# Patient Record
Sex: Female | Born: 1960
Health system: Southern US, Community
[De-identification: ages and names within clinical notes are randomized; demographics above are authoritative.]

## PROBLEM LIST (undated history)

## (undated) DIAGNOSIS — E785 Hyperlipidemia, unspecified: Secondary | ICD-10-CM

## (undated) DIAGNOSIS — G500 Trigeminal neuralgia: Secondary | ICD-10-CM

## (undated) DIAGNOSIS — K219 Gastro-esophageal reflux disease without esophagitis: Secondary | ICD-10-CM

## (undated) DIAGNOSIS — M1990 Unspecified osteoarthritis, unspecified site: Secondary | ICD-10-CM

## (undated) DIAGNOSIS — G4700 Insomnia, unspecified: Secondary | ICD-10-CM

## (undated) DIAGNOSIS — M310 Hypersensitivity angiitis: Secondary | ICD-10-CM

## (undated) DIAGNOSIS — E7800 Pure hypercholesterolemia, unspecified: Secondary | ICD-10-CM

## (undated) DIAGNOSIS — I10 Essential (primary) hypertension: Secondary | ICD-10-CM

## (undated) DIAGNOSIS — M79673 Pain in unspecified foot: Secondary | ICD-10-CM

## (undated) DIAGNOSIS — F419 Anxiety disorder, unspecified: Secondary | ICD-10-CM

## (undated) DIAGNOSIS — M542 Cervicalgia: Secondary | ICD-10-CM

## (undated) HISTORY — PX: CERVICAL SPINE SURGERY: SHX589

## (undated) HISTORY — PX: MOUTH SURGERY: SHX715

## (undated) HISTORY — DX: Hypersensitivity angiitis: M31.0

## (undated) HISTORY — DX: Hyperlipidemia, unspecified: E78.5

## (undated) HISTORY — DX: Cervicalgia: M54.2

## (undated) HISTORY — PX: CARPAL TUNNEL RELEASE: SHX101

## (undated) HISTORY — PX: FINGER SURGERY: SHX640

## (undated) HISTORY — DX: Pain in unspecified foot: M79.673

## (undated) HISTORY — PX: EYE SURGERY: SHX253

## (undated) HISTORY — DX: Pure hypercholesterolemia, unspecified: E78.00

## (undated) HISTORY — DX: Insomnia, unspecified: G47.00

## (undated) HISTORY — PX: ENDOMETRIAL ABLATION: SHX621

## (undated) HISTORY — DX: Trigeminal neuralgia: G50.0

## (undated) HISTORY — PX: BREAST SURGERY: SHX581

---

## 1998-05-05 ENCOUNTER — Other Ambulatory Visit: Admission: RE | Admit: 1998-05-05 | Discharge: 1998-05-05 | Payer: Self-pay | Admitting: Obstetrics & Gynecology

## 1999-12-12 ENCOUNTER — Other Ambulatory Visit: Admission: RE | Admit: 1999-12-12 | Discharge: 1999-12-12 | Payer: Self-pay | Admitting: Obstetrics & Gynecology

## 2000-03-17 ENCOUNTER — Ambulatory Visit (HOSPITAL_COMMUNITY): Admission: RE | Admit: 2000-03-17 | Discharge: 2000-03-17 | Payer: Self-pay | Admitting: Family Medicine

## 2001-03-03 ENCOUNTER — Other Ambulatory Visit: Admission: RE | Admit: 2001-03-03 | Discharge: 2001-03-03 | Payer: Self-pay | Admitting: Obstetrics & Gynecology

## 2002-07-13 ENCOUNTER — Other Ambulatory Visit: Admission: RE | Admit: 2002-07-13 | Discharge: 2002-07-13 | Payer: Self-pay | Admitting: Obstetrics & Gynecology

## 2002-12-24 ENCOUNTER — Ambulatory Visit (HOSPITAL_COMMUNITY): Admission: RE | Admit: 2002-12-24 | Discharge: 2002-12-24 | Payer: Self-pay | Admitting: *Deleted

## 2002-12-24 ENCOUNTER — Encounter: Payer: Self-pay | Admitting: *Deleted

## 2003-01-14 ENCOUNTER — Encounter: Payer: Self-pay | Admitting: Neurosurgery

## 2003-01-18 ENCOUNTER — Inpatient Hospital Stay (HOSPITAL_COMMUNITY): Admission: RE | Admit: 2003-01-18 | Discharge: 2003-01-19 | Payer: Self-pay | Admitting: Neurosurgery

## 2003-01-18 ENCOUNTER — Encounter: Payer: Self-pay | Admitting: Neurosurgery

## 2003-08-04 ENCOUNTER — Other Ambulatory Visit: Admission: RE | Admit: 2003-08-04 | Discharge: 2003-08-04 | Payer: Self-pay | Admitting: Obstetrics & Gynecology

## 2004-08-02 ENCOUNTER — Ambulatory Visit (HOSPITAL_BASED_OUTPATIENT_CLINIC_OR_DEPARTMENT_OTHER): Admission: RE | Admit: 2004-08-02 | Discharge: 2004-08-02 | Payer: Self-pay | Admitting: Orthopedic Surgery

## 2004-08-20 ENCOUNTER — Encounter: Admission: RE | Admit: 2004-08-20 | Discharge: 2004-11-18 | Payer: Self-pay | Admitting: Orthopedic Surgery

## 2004-11-14 ENCOUNTER — Other Ambulatory Visit: Admission: RE | Admit: 2004-11-14 | Discharge: 2004-11-14 | Payer: Self-pay | Admitting: Obstetrics & Gynecology

## 2005-02-04 ENCOUNTER — Encounter (INDEPENDENT_AMBULATORY_CARE_PROVIDER_SITE_OTHER): Payer: Self-pay | Admitting: *Deleted

## 2005-02-04 ENCOUNTER — Ambulatory Visit (HOSPITAL_COMMUNITY): Admission: RE | Admit: 2005-02-04 | Discharge: 2005-02-04 | Payer: Self-pay | Admitting: Obstetrics & Gynecology

## 2005-12-03 ENCOUNTER — Other Ambulatory Visit: Admission: RE | Admit: 2005-12-03 | Discharge: 2005-12-03 | Payer: Self-pay | Admitting: Obstetrics & Gynecology

## 2006-01-31 ENCOUNTER — Ambulatory Visit (HOSPITAL_COMMUNITY): Admission: RE | Admit: 2006-01-31 | Discharge: 2006-01-31 | Payer: Self-pay | Admitting: Neurosurgery

## 2007-01-01 ENCOUNTER — Ambulatory Visit (HOSPITAL_COMMUNITY): Admission: RE | Admit: 2007-01-01 | Discharge: 2007-01-01 | Payer: Self-pay | Admitting: Obstetrics & Gynecology

## 2008-01-14 ENCOUNTER — Ambulatory Visit (HOSPITAL_BASED_OUTPATIENT_CLINIC_OR_DEPARTMENT_OTHER): Admission: RE | Admit: 2008-01-14 | Discharge: 2008-01-14 | Payer: Self-pay | Admitting: Orthopedic Surgery

## 2008-01-28 ENCOUNTER — Ambulatory Visit (HOSPITAL_COMMUNITY): Admission: RE | Admit: 2008-01-28 | Discharge: 2008-01-28 | Payer: Self-pay | Admitting: Obstetrics & Gynecology

## 2009-06-29 ENCOUNTER — Ambulatory Visit (HOSPITAL_COMMUNITY): Admission: RE | Admit: 2009-06-29 | Discharge: 2009-06-29 | Payer: Self-pay | Admitting: Obstetrics & Gynecology

## 2009-10-29 ENCOUNTER — Emergency Department (HOSPITAL_COMMUNITY): Admission: EM | Admit: 2009-10-29 | Discharge: 2009-10-29 | Payer: Self-pay | Admitting: Emergency Medicine

## 2010-08-14 ENCOUNTER — Encounter
Admission: RE | Admit: 2010-08-14 | Discharge: 2010-08-14 | Payer: Self-pay | Source: Home / Self Care | Attending: Family Medicine | Admitting: Family Medicine

## 2010-09-13 DIAGNOSIS — F411 Generalized anxiety disorder: Secondary | ICD-10-CM | POA: Insufficient documentation

## 2010-09-13 DIAGNOSIS — I776 Arteritis, unspecified: Secondary | ICD-10-CM | POA: Insufficient documentation

## 2010-09-13 DIAGNOSIS — K219 Gastro-esophageal reflux disease without esophagitis: Secondary | ICD-10-CM | POA: Insufficient documentation

## 2010-09-13 DIAGNOSIS — I10 Essential (primary) hypertension: Secondary | ICD-10-CM | POA: Insufficient documentation

## 2010-09-13 DIAGNOSIS — E785 Hyperlipidemia, unspecified: Secondary | ICD-10-CM | POA: Insufficient documentation

## 2010-09-14 ENCOUNTER — Ambulatory Visit
Admission: RE | Admit: 2010-09-14 | Discharge: 2010-09-14 | Payer: Self-pay | Source: Home / Self Care | Attending: Pulmonary Disease | Admitting: Pulmonary Disease

## 2010-09-14 DIAGNOSIS — R05 Cough: Secondary | ICD-10-CM | POA: Insufficient documentation

## 2010-09-20 NOTE — Assessment & Plan Note (Signed)
Summary: Pulmonary consultation/ chronic cough   Visit Type:  Initial Consult Copy to:  Dr. Blair Heys Primary Provider/Referring Provider:  Dr. Blair Heys  CC:  Cough.  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 rx with Nexium helped hb ? if helped cough, then stopped p 6 weeks much worse cough and reflux, so Nexium resumed at hs and some better.  Pt denies any significant  itching, sneezing,  nasal congestion or excess secretions,  fever, chills, sweats, unintended wt loss, pleuritic or exertional cp, hempoptysis, change in activity tolerance  orthopnea pnd or leg swelling. Pt also denies any obvious fluctuation in symptoms with weather or environmental change or other alleviating or aggravating factors.       Allergies (verified): 1)  ! 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  The patient denies shortness of breath at rest, productive cough, coughing up blood, chest pain, irregular heartbeats, loss of  appetite, weight change, abdominal pain, sore throat, tooth/dental problems, headaches, sneezing, itching, ear ache, anxiety, depression, hand/feet swelling, joint stiffness or pain, rash, change in color of mucus, and fever.    Vital Signs:  Patient profile:   50 year old female Height:      69 inches Weight:      201 pounds BMI:     29.79 O2 Sat:      98 % on Room air Temp:     98.5 degrees F oral Pulse rate:   78 / minute BP sitting:   122 / 70  (left arm)  Vitals Entered By: Vernie Murders (September 14, 2010 9:49 AM)  O2 Flow:  Room air  Physical Exam  Additional Exam:  wt 201 September 14, 2010   HEENT: nl dentition, mild nonspecific bilateral edema of both turbinates, and orophanx. Nl external ear canals without cough reflex NECK :  without JVD/Nodes/TM/ nl carotid upstrokes bilaterally LUNGS: no acc muscle use, clear to A and P bilaterally with  cough on early  insp   CV:  RRR  no s3 or murmur or increase in P2, no edema  ABD:  soft and nontender with nl excursion in the supine position. No bruits or organomegaly, bowel sounds nl MS:  warm without deformities, calf tenderness, cyanosis or clubbing SKIN: warm and dry without lesions   NEURO:  alert, approp, no deficits     CXR  Procedure date:  08/14/2010  Findings:      wnl  Impression & Recommendations:  Problem # 1:  COUGH (ICD-786.2)  The most common causes of chronic cough in immunocompetent adults include:  upper airway cough syndrome (UACS), previously referred to as postnasal drip syndrome,  caused by variety of rhinosinus conditions; (2) asthma; (3) GERD; (4) chronic bronchitis from cigarette smoking or other inhaled environmental irritants; (5) nonasthmatic eosinophilic bronchitis; and (6) bronchiectasis. These conditions, singly or in combination, have accounted for up to 94% of the causes of chronic cough in prospective studies.   Most likely this is a form of  Classic Upper airway cough syndrome, so named  because it's frequently impossible to sort out how much is  CR/sinusitis with freq throat clearing (which can be related to primary GERD)   vs  causing  secondary extra esophageal GERD from wide swings in gastric pressure that occur with throat clearing, promoting self use of mint and menthol lozenges that reduce the lower esophageal sphincter tone and exacerbate the problem further These are the same pts who not infrequently have failed to tolerate ace inhibitors,  dry powder inhalers or biphosphonates or report having reflux symptoms that don't respond to standard doses of PPI  Note she could not tolerate ace and is not responding to standard doses of ppi.  The standardized cough guidelines recently published in Chest are a 14 step process, not a single office visit,  and are intended  to address this problem logically,  with an alogrithm dependent on response to each progressive step  to determine a specific diagnosis with  minimal addtional testing needed. Therefore if compliance is an issue this empiric standardized approach simply won't work.   See instructions for specific recommendations   Orders: Consultation Level V [phone])  Problem # 2:  G E R D (ICD-530.81) Of the three most common causes of chronic cough, only one (GERD)  can actually cause the other two and perpetuate the cylce of cough inducing airway trauma, inflammation, heightened sensitivity to reflux which is prompted by the cough itself via a cyclical mechanism.  This may partially respond to steroids and look like asthma and post nasal drainage but never erradicated completely unless the cough and the secondary reflux are eliminated, preferably both at the same time. See instructions for specific recommendations   Medications Added to Medication List This Visit: 1)  Tramadol Hcl 50 Mg Tabs (Tramadol hcl) .... One to two by mouth every 4-6 hours if needed for cough 2)  Pepcid 20 Mg Tabs (Famotidine) .... Take one by mouth at  bedtime 3)  Prednisone 10 Mg Tabs (Prednisone) .... 4 each am x 2days, 2x2days, 1x2days and stop  Patient Instructions: 1)  Prednisone 10 mg 4 each am x 2days, 2x2days, 1x2days and stop  2)  Take delsym two tsp every 12 hours and add tramadol 50 mg up to 2 every 4 hours to suppress the urge to cough. Swallowing water or using ice chips/non mint and menthol containing candies (such as lifesavers or sugarless jolly ranchers) are also effective.  3)  Nexium 40 mg 30 min before first meal and pepcid 20 mg at bedtime as long as coughing ( reflux is to cough what oxygen is to fire)  4)  GERD (REFLUX)  is a common cause of respiratory symptoms. It commonly presents without heartburn and can be treated with medication, but also with lifestyle changes including avoidance of late meals, excessive alcohol, smoking cessation, and avoid fatty foods, chocolate, peppermint, colas, red wine, and acidic juices such as orange juice. NO MINT OR MENTHOL PRODUCTS SO NO COUGH DROPS  5)  USE SUGARLESS CANDY INSTEAD (jolley ranchers)  6)  NO  OIL BASED VITAMINS  7)  If not better in two weeks need to return to regroup 8)  LATE ADD did not disclose celexa but should stop this while requiring high doses of tramadol to control cyclical coughing Prescriptions: PREDNISONE 10 MG  TABS (PREDNISONE) 4 each am x 2days, 2x2days, 1x2days and stop  #14 x 0   Entered and Authorized by:   Nyoka Cowden MD   Signed by:   Nyoka Cowden MD on 09/14/2010   Method used:   Electronically to        [facility]* (retail)       [address]       [place], [place]  [address]       Ph: [phone]       Fax: [phone]   RxID:   [id number] TRAMADOL HCL 50 MG  TABS (TRAMADOL HCL) One to two by mouth every 4-6 hours if needed for cough  #40 x 0   Entered and Authorized by:   Nyoka Cowden MD   Signed by:   Nyoka Cowden MD on 09/14/2010   Method used:   Electronically to        [facility]*  (retail)       [address]       [place], [place]  [address]       Ph: [phone]       Fax: [phone]   RxID:   [id number]

## 2010-12-17 ENCOUNTER — Other Ambulatory Visit (HOSPITAL_COMMUNITY): Payer: Self-pay | Admitting: Obstetrics & Gynecology

## 2010-12-17 DIAGNOSIS — Z1231 Encounter for screening mammogram for malignant neoplasm of breast: Secondary | ICD-10-CM

## 2011-01-01 NOTE — Op Note (Signed)
NAMEMONIKA, CHESTANG              ACCOUNT NO.:  [id number]   MEDICAL RECORD NO.:  [id number]          PATIENT TYPE:  AMB   LOCATION:  DSC                          FACILITY:  [facility]   PHYSICIAN:  Dionne Ano. Gramig III, M.D.DATE OF BIRTH:  April 28, 1961   DATE OF PROCEDURE:  DATE OF DISCHARGE:                               OPERATIVE REPORT   PREOPERATIVE DIAGNOSIS:  Left carpal tunnel syndrome.   POSTOPERATIVE DIAGNOSIS:  Left carpal tunnel syndrome.   PROCEDURES:  1. Left median nerve/peripheral nerve block at the wrist and forearm      level for anesthetic purposes for carpal tunnel release.  2. Left limited open carpal tunnel release.   SURGEON:  Dionne Ano. Amanda Pea, MD   ASSISTANT:  None.   COMPLICATIONS:  None.   ANESTHESIA:  Peripheral nerve block with IV sedation keeping the patient  awake, alert, and oriented the entire case.   TOURNIQUET TIME:  Less than 10 minutes.   ESTIMATED BLOOD LOSS:  Minimal.   INDICATIONS FOR PROCEDURE:  Ms. Berdan is a 50 year old female who  presents with the above-mentioned diagnosis.  I have counseled her in  regards to risks and benefits of the surgery including risk of  infection, bleeding anesthesia, damage to normal structures, and failure  of surgery to accomplish its intended goals of relieving symptoms and  restoring function.  With this in mind, she desires to proceed.  All  questions have been encouraged and answered preoperatively.   OPERATIVE PROCEDURE:  The patient was seen by myself, anesthesia, taken  to the operative suite, and underwent a smooth induction of peripheral  nerve/mean nerve block.  She was given 2 of Versed and 1 of fentanyl for  IV sedation during this.  Following this, she was prepped and draped in  the usual sterile fashion with Betadine scrub and paint.  Ancef was  given.  Arm was previously marked and consent obtained.  Following this,  a tourniquet was insufflated to 250 mmHg.  The patient underwent a  1-cm  incision at the distal edge of the transverse carpal ligament.  Dissection was carried down.  Palmar fascia incised distally and the  transverse carpal ligament was released under 4.0 loop magnification  without difficulty.  Fat pad egressed nicely.  Hemostasis was secured.  Following this, distal to proximal dissection was carried out after  adequate room was available for __________ device one, two, and three  which was placed just under the proximal leading leaflet of the  transverse carpal ligament.  The patient tolerated this well.  There  were no complicating features.  Security clip was then placed after it  disengaged, very nicely placed and security clip effectively releasing  the proximal leaflet.  Following this, I inspected the canal.  She was  completely released and looked excellent.  The tourniquet was deflated.  Hemostasis was obtained with bipolar electrocautery.  The wound was  lavaged and sutured with Prolene.  She was dressed sterilely and taken  to the recovery room.  She will be discharged home on Vicodin and  Robaxin.  I have asked her to  elevate, move fingers frequently, and  return to see me in a week and notify me if she have any problems.   It has been an absolute pleasure to see and participate in her care.  We  look forward to participate in her postoperative progress.      Dionne Ano. Everlene Other, M.D.     Nash Mantis  D:  01/14/2008  T:  01/15/2008  Job:  [id number]

## 2011-01-03 ENCOUNTER — Other Ambulatory Visit (HOSPITAL_COMMUNITY): Payer: Self-pay | Admitting: Obstetrics & Gynecology

## 2011-01-03 ENCOUNTER — Ambulatory Visit (HOSPITAL_COMMUNITY)
Admission: RE | Admit: 2011-01-03 | Discharge: 2011-01-03 | Disposition: A | Discharge status: Home / Self Care | Payer: BC Managed Care – PPO | Source: Ambulatory Visit | Attending: Obstetrics & Gynecology | Admitting: Obstetrics & Gynecology

## 2011-01-03 DIAGNOSIS — Z1231 Encounter for screening mammogram for malignant neoplasm of breast: Secondary | ICD-10-CM

## 2011-01-04 NOTE — Op Note (Signed)
NAME:  Charlotte [name], Charlotte [name]                        ACCOUNT NO.:  [id number]   MEDICAL RECORD NO.:  [id number]                   PATIENT TYPE:  INP   LOCATION:  2861                                 FACILITY:  [facility]   PHYSICIAN:  Payton Doughty, M.D.                   DATE OF BIRTH:  11/25/1960   DATE OF PROCEDURE:  01/18/2003  DATE OF DISCHARGE:                                 OPERATIVE REPORT   PREOPERATIVE DIAGNOSES:  Cervical spondylosis at C4-5 and C5-6 with  myelopathy.   POSTOPERATIVE DIAGNOSES:  Cervical spondylosis at C4-5 and C5-6 with  myelopathy.   OPERATION/PROCEDURE:  C4-5 and C5-6 anterior cervical diskectomy and fusion  with a Tiger plate.   OPERATING SERVICE:  Neurosurgery   ANESTHESIA:  General endotracheal.   COMPLICATIONS:  None.   NURSE ASSISTANT:  [place].   DOCTOR ASSISTANT:  Dr. Lovell Sheehan.   DESCRIPTION OF PROCEDURE:  This is a 50 year old right-handed white female  with severe cervical spondylitis myelopathy.  She was taken to the operating  room, smoothly induced and  intubated, placed supine on the operating room  table, placed in the Holter head traction.  Following shave, prep and drape  in the usual sterile fashion, the skin was incised in the midline in the  medial border of the sternocleidomastoid muscle on the left side at about  the level of the carotid tubercle.  The platysma was identified, elevated,  divided and undermined.  The sternocleidomastoid was then identified, medial  dissection revealed the carotid artery retracted laterally to the left.  The  trachea and esophagus retracted laterally to the right.  The bones of the  anterior cervical spine were thus exposed.  A marker was placed intra-  operatively x-ray obtain to confirm correctness of this level.  The longus  coli were taken down bilaterally and self-retaining Chevron retractor  placed, carefully protecting the esophagus.  The diskectomy was carried out  at C5-6 and C4-5, first  under gross observation.  The operating microscope  was then brought in and we used microdissection technique to dissect the  anterior epidural space, remove the posterior longitudinal ligament and  carefully decompress the nerve roots.  Off to the right side at each level,  there was significant disk with compression of the thecal sac.  Removal of  this resulted in immediate decompression.  The nerve roots were carefully  explored and found to be unencumbered.  The wound was irrigated and  hemostasis was assured.  A 7 mm bone graft was fashioned,  a patellar  allograft, and tapped into place at each level.  A 41 mm Tiger plate was  then contoured and placed across the disk spaces.  It was secured there with  13 mm screws.  Intraoperative x-rays showed good placement of bone graft,  the plate and screws.  The wound was irrigated and hemostasis assured.  The  platysma  was reapproximated with 3-0 Vicryl in an interrupted fashion.  The  subcutaneous tissue was reapproximated with 3-0 Vicryl in an interrupted  fashion.  The subcuticular tissues were reapproximated with 3-0  Vicryl in an interrupted fashion.  The skin was closed with 4-0 Vicryl in a  running subcuticular fashion.  Benzoin and Steri-Strips were placed and  occlusive Telfa and OpSite and the patient returned to the recovery room in  good condition after being placed in the Aspen collar.                                                Payton Doughty, M.D.    MWR/MEDQ  D:  01/18/2003  T:  01/18/2003  Job:  [id number]

## 2011-01-04 NOTE — Op Note (Signed)
NAMEKENDALLYN, Charlotte [name]              ACCOUNT NO.:  [id number]   MEDICAL RECORD NO.:  [id number]          PATIENT TYPE:  AMB   LOCATION:  SDC                           FACILITY:  [facility]   PHYSICIAN:  Freddy Finner, M.D.   DATE OF BIRTH:  [birth year]   DATE OF PROCEDURE:  02/04/2005  DATE OF DISCHARGE:                                 OPERATIVE REPORT   PREOPERATIVE DIAGNOSES:  1.  Fibroids.  2.  Menorrhagia.   POSTOPERATIVE DIAGNOSES:  1.  Fibroids.  2.  Menorrhagia.   OPERATIVE PROCEDURE:  Hysteroscopy D&C and NovaSure endometrial ablation.   SURGEON:  Freddy Finner, M.D.   ANESTHESIA:  Managed intravenous sedation and paracervical block.   INTRAOPERATIVE COMPLICATIONS:  None.   The patient is a 50 year old with very heavy menstrual periods. She had a  sonohysterogram in the office which showed no intracavitary abnormality but  did show intramural leiomyomata. Because of the very heavy nature of her  periods and the relative disability that this produces, she has requested  surgical  intervention. She is now admitted for NovaSure endometrial  ablation.   She was admitted on the morning of surgery. She was given an antibiotic  bolus preoperatively. She was brought to the operating room, there placed  under adequate intravenous sedation, placed in the dorsal lithotomy position  using the Hana stirrup system. Betadine prep was carried out in the usual  fashion. Sterile drapes were applied. Bivalve speculum was introduced into  the vagina, cervix visualized and grasped on the anterior lip with a single  tooth tenaculum. Cervix sounded to 3 cm. Uterus sounded to 9 cm. Cervix was  dilated with Hegar dilators to 23. The 12.5-degree A[facility]I hysteroscope was  introduced and the cavity inspected. No intracavitary abnormality was noted.  Photographs were made. Gentle thorough curettage and exploration with  Randall stone forceps were performed and tissue submitted for histologic  exam. The  NovaSure device was then applied in the standard fashion without  difficulty. Cavity width was 4.8 cm. Power utilized during the procedure was  158 watts. Coagulation time 59 seconds. Reinspection of the cavity revealed  complete endometrial ablation by inspection. Again, photographs were  made of this and retained in the office record. The instruments were  removed, the procedure was terminated. She was given 30 mg of Toradol IV, 30  mg IM, and was taken to recovery in good condition. She will be discharged  in the immediate postoperative period for follow-up in the office at  approximately 7-10 days.      Freddy Finner, M.D.  Electronically Signed     WRN/MEDQ  D:  02/04/2005  T:  02/04/2005  Job:  [id number]

## 2011-01-04 NOTE — H&P (Signed)
NAME:  Charlotte [name], Charlotte [name]                        ACCOUNT NO.:  [id number]   MEDICAL RECORD NO.:  [id number]                   PATIENT TYPE:  INP   LOCATION:  2861                                 FACILITY:  [facility]   PHYSICIAN:  Payton Doughty, M.D.                   DATE OF BIRTH:  1961/03/03   DATE OF ADMISSION:  01/18/2003  DATE OF DISCHARGE:                                HISTORY & PHYSICAL   ADMISSION DIAGNOSIS:  Cervical spondylosis with myelopathy.   SURGEON:  No surgery.   HISTORY OF PRESENT ILLNESS:  Ms. Rufo is a 50 year old right-handed  white girl who, for approximately years, has been having numbness in her  right arm and noticed it is worsening over the past couple of months, and  stumbling of her right leg.  MR demonstrates severe spondylosis of the  cervical cord with cord compression.  She is now admitted for an anterior  cervicectomy and fusion at C4-5 and C5-6.   PAST MEDICAL HISTORY:  Unremarkable for significant diseases.   MEDICATIONS:  1. She uses Zoloft 100 mg per day.  2. Flexeril 5 mg per day.  3. Vioxx 50 mg per day.   PAST SURGICAL HISTORY:  1. Breast implants in '81.  2. Oral surgery in '80.  3. Laser eye operation in 2002.   ALLERGIES:  She is allergic to VITAMIN C.   SOCIAL HISTORY:  She does not smoke, drinks on a social basis, and is a  Engineer, civil (consulting).   FAMILY HISTORY:  Mother is 22 and very healthy.  Father is 16, in good  health.  There is thyroid disease, arthritis, hypertension, and borderline  diabetes.   REVIEW OF SYSTEMS:  Remarkable for difficulty with coordination.   PHYSICAL EXAMINATION:  HEENT EXAM:  Within normal limits.  NECK:  She has limited range of motion of her neck but some Lhermitte's  sign.  CHEST:  Clear.  CARDIAC EXAM:  Regular rate and rhythm.  ABDOMEN:  Nontender with no hepatosplenomegaly.  EXTREMITIES:  Without clubbing or cyanosis.  GU EXAM:  Deferred.  Peripheral pulses are good.  NEUROLOGICAL:  She is awake,  alert and oriented.  Her cranial nerves are  intact.  Motor exam, she has 5/5 strength throughout the left side and the  right lower extremity; in the right upper extremity, she has weakness of the  deltoid, biceps, and triceps at about 5-/5.  Sensory deficit, as described,  in her right C5-6 distribution.  Reflexes are 3 at the biceps on the right,  1 on the left, 3 at the triceps on the right, 2 on the left.  Right knee  jerk is 3, the left is 2, ankle jerks are 2.  There is no clonus.  Hoffmann's is positive on the right.   LABORATORY DATA:  MR demonstrates significant cervical spondylitic disease  at 4-5 and 5-6 with cord compression.  CLINICAL IMPRESSION:  Cervical spondylosis with myelopathy.   PLAN:  For an anterior cervicectomy and fusion at C4-5 and C5-6.  The risks  and benefits of this approach have been discussed with her, and she wishes  to proceed.                                               Payton Doughty, M.D.    MWR/MEDQ  D:  01/18/2003  T:  01/18/2003  Job:  [id number]

## 2011-01-04 NOTE — Op Note (Signed)
NAMEMARKEL, MERGENTHALER              ACCOUNT NO.:  [id number]   MEDICAL RECORD NO.:  [id number]          PATIENT TYPE:  AMB   LOCATION:  DSC                          FACILITY:  [facility]   PHYSICIAN:  Dionne Ano. Gramig III, M.D.DATE OF BIRTH:  03/28/1961   DATE OF PROCEDURE:  08/02/2004  DATE OF DISCHARGE:                                 OPERATIVE REPORT   PREOPERATIVE DIAGNOSIS:  Right carpal tunnel syndrome.   POSTOPERATIVE DIAGNOSIS:  Right carpal tunnel syndrome.   PROCEDURES:  1.  Right median nerve/peripheral nerve block at the wrist __________ carpal      tunnel.  2.  Right __________ carpal tunnel.   SURGEON:  Dionne Ano. Amanda Pea, M.D.   ASSISTANT:  Karie Chimera, P.A.-C.   COMPLICATIONS:  None.   ANESTHESIA:  Peripheral nerve block administered by M.D. with light IV  sedation keeping the patient awake, alert and oriented during the entire  case.   TOURNIQUET TIME:  Less than 10 minutes.   DRAINS:  None.   ESTIMATED BLOOD LOSS:  Minimal.   INDICATION FOR PROCEDURE:  This patient is a very pleasant female who  presents with the above-mentioned diagnosis.  I have counseled she in regard  to the risks and benefits of surgery, and she agrees to proceed with the  operative intervention as described above.  All risks and benefits were  discussed.   OPERATIVE FINDINGS:  The patient had a thickened transverse carpal ligament  separated by Potts incision.  There were no space-occupying lesions in the  canal; however, deep canal inspection was not carried out as this was  limited to open approach through a 1 cm incision.   OPERATIVE PROCEDURE IN DETAIL:  The patient was seen by myself and  anesthesia, taken to the operative suite, and underwent smooth induction of  peripheral nerve block/median nerve block.  She tolerated this well.  She  was then prepped and draped a second time with Betadine scrub and paint.  She had 1 of fentanyl and 1 of Versed during this and was  completely awake,  alert and oriented in the entire case.  Following this, the patient then  underwent elevation of the arm.  The tourniquet was insufflated to 250 mmHg.  A 1 cm incision was made at the digital edge of the transverse carpal  ligament.  Dissection was carried through skin with a knife blade, the  palmar fascia incised.  The digital edge of the transverse carpal ligament  was released under 4.0 loupe magnification.  The fat pad egressed nicely.  There was no aberrant median nerve anatomy.  Then following this, distal to  proximal dissection was carried out until adequate room was adequate for  canal preparatory device one, two, and three, which were placed directly in  the midline without patient discomfort or problems.  I then placed a  security clip, the obturator disengaged nicely, indicating correct  placement, and following this placed the security knife and the security  clip, effectively releasing the proximal leaf of the transverse carpal  ligament.  Once this was done, I then removed the security  clip.  I  identified the canal.  She was completely released about the transverse  carpal ligament.  The median intact.  There were no complicating features,  and she was awake, alert and oriented the entire case and had no problems.  We deflated the tourniquet, obtained hemostasis, irrigated copiously, and  closed the wound with interrupted Prolene.  She tolerated this well.  She  will be discharged home on Vicodin and Robaxin after a sterile dressing was  applied and she was transferred to the recovery room.  All sponge, needle,  and instrument counts were reported as correct as noted.   It was a pleasure to see and treat her today.  We look forward to  participating in her postoperative care.       WMG/MEDQ  D:  08/02/2004  T:  08/02/2004  Job:  [id number]

## 2011-04-03 ENCOUNTER — Other Ambulatory Visit: Payer: Self-pay | Admitting: Gastroenterology

## 2011-05-15 LAB — BASIC METABOLIC PANEL
CO2: 24
Chloride: 107
GFR calc Af Amer: >60
GFR calc non Af Amer: >60
Potassium: 4.3

## 2011-05-15 LAB — POCT HEMOGLOBIN-HEMACUE: Hemoglobin: 10 — ABNORMAL LOW

## 2012-10-21 ENCOUNTER — Other Ambulatory Visit: Payer: Self-pay | Admitting: Occupational Medicine

## 2012-10-21 ENCOUNTER — Ambulatory Visit: Payer: Self-pay

## 2012-10-21 DIAGNOSIS — M25572 Pain in left ankle and joints of left foot: Secondary | ICD-10-CM

## 2012-11-20 ENCOUNTER — Other Ambulatory Visit: Payer: Self-pay | Admitting: Occupational Medicine

## 2012-11-20 ENCOUNTER — Ambulatory Visit: Payer: Self-pay

## 2012-11-20 DIAGNOSIS — R7612 Nonspecific reaction to cell mediated immunity measurement of gamma interferon antigen response without active tuberculosis: Secondary | ICD-10-CM

## 2013-05-05 ENCOUNTER — Other Ambulatory Visit (HOSPITAL_COMMUNITY): Payer: Self-pay | Admitting: Neurosurgery

## 2013-05-05 DIAGNOSIS — M4712 Other spondylosis with myelopathy, cervical region: Secondary | ICD-10-CM

## 2013-05-12 ENCOUNTER — Ambulatory Visit (HOSPITAL_COMMUNITY)
Admission: RE | Admit: 2013-05-12 | Discharge: 2013-05-12 | Disposition: A | Discharge status: Home / Self Care | Payer: 59 | Source: Ambulatory Visit | Attending: Neurosurgery | Admitting: Neurosurgery

## 2013-05-12 DIAGNOSIS — M5380 Other specified dorsopathies, site unspecified: Secondary | ICD-10-CM | POA: Insufficient documentation

## 2013-05-12 DIAGNOSIS — M5030 Other cervical disc degeneration, unspecified cervical region: Secondary | ICD-10-CM | POA: Insufficient documentation

## 2013-05-12 DIAGNOSIS — M542 Cervicalgia: Secondary | ICD-10-CM | POA: Insufficient documentation

## 2013-05-12 DIAGNOSIS — M79609 Pain in unspecified limb: Secondary | ICD-10-CM | POA: Insufficient documentation

## 2013-05-12 DIAGNOSIS — T8489XA Other specified complication of internal orthopedic prosthetic devices, implants and grafts, initial encounter: Secondary | ICD-10-CM | POA: Insufficient documentation

## 2013-05-12 DIAGNOSIS — Y831 Surgical operation with implant of artificial internal device as the cause of abnormal reaction of the patient, or of later complication, without mention of misadventure at the time of the procedure: Secondary | ICD-10-CM | POA: Insufficient documentation

## 2013-05-12 DIAGNOSIS — M4712 Other spondylosis with myelopathy, cervical region: Secondary | ICD-10-CM

## 2013-05-12 DIAGNOSIS — Z981 Arthrodesis status: Secondary | ICD-10-CM | POA: Insufficient documentation

## 2013-05-27 ENCOUNTER — Other Ambulatory Visit: Payer: Self-pay | Admitting: Neurosurgery

## 2013-07-01 ENCOUNTER — Encounter (HOSPITAL_COMMUNITY): Payer: Self-pay | Admitting: Pharmacy Technician

## 2013-07-02 ENCOUNTER — Encounter (HOSPITAL_COMMUNITY): Payer: Self-pay

## 2013-07-02 ENCOUNTER — Encounter (HOSPITAL_COMMUNITY)
Admission: RE | Admit: 2013-07-02 | Discharge: 2013-07-02 | Disposition: A | Discharge status: Home / Self Care | Payer: 59 | Source: Ambulatory Visit | Attending: Neurosurgery | Admitting: Neurosurgery

## 2013-07-02 DIAGNOSIS — Z01812 Encounter for preprocedural laboratory examination: Secondary | ICD-10-CM | POA: Insufficient documentation

## 2013-07-02 HISTORY — DX: Anxiety disorder, unspecified: F41.9

## 2013-07-02 HISTORY — DX: Unspecified osteoarthritis, unspecified site: M19.90

## 2013-07-02 LAB — BASIC METABOLIC PANEL
Calcium: 8.9 mg/dL (ref 8.4–10.5)
Chloride: 103 mEq/L (ref 96–112)
GFR calc non Af Amer: >90 mL/min (ref >90)
Glucose, Bld: 95 mg/dL (ref 70–99)

## 2013-07-02 LAB — CBC
MCV: 87.9 fL (ref 78.0–100.0)
RBC: 4.28 MIL/uL (ref 3.87–5.11)

## 2013-07-02 LAB — SURGICAL PCR SCREEN: Staphylococcus aureus: NEGATIVE

## 2013-07-02 NOTE — Progress Notes (Addendum)
Patient would like to speak with surgeon prior to signing permit.   DA Requesting stress test from Dr. Erskine Emery Smith's...Marland KitchenDA

## 2013-07-02 NOTE — Pre-Procedure Instructions (Signed)
KHALEELAH YOWELL  07/02/2013   Your procedure is scheduled on: Tuesday, November 18th     Report to [facility]  2 * 3 at 5:30 AM.   Call this number if you have problems the morning of surgery: [phone]   Remember:   Do not eat food or drink liquids after midnight Monday.   Take these medicines the morning of surgery with A SIP OF WATER: Xanax, Celexa   Do not wear jewelry, make-up or nail polish.  Do not wear lotions, powders, or perfumes. You may wear deodorant.  Do not shave unerarms & legs 48 hours prior to surgery.    Do not bring valuables to the hospital.  [facility] is not responsible for any belongings or valuables.               Contacts, dentures or bridgework may not be worn into surgery.  Leave suitcase in the car. After surgery it may be brought to your room.  For patients admitted to the hospital, discharge time is determined by your treatment team.               Name and phone number of your driver:  Rosanne Ashing  --  Spouse  Special Instructions: Shower using CHG 2 nights before surgery and the night before surgery.  If you shower the day of surgery use CHG.  Use special wash - you have one bottle of CHG for all showers.  You should use approximately 1/3 of the bottle for each shower.   Please read over the following fact sheets that you were given: Pain Booklet, MRSA Information and Surgical Site Infection Prevention

## 2013-07-05 ENCOUNTER — Other Ambulatory Visit (HOSPITAL_COMMUNITY): Payer: Self-pay | Admitting: Obstetrics & Gynecology

## 2013-07-05 DIAGNOSIS — Z1231 Encounter for screening mammogram for malignant neoplasm of breast: Secondary | ICD-10-CM

## 2013-07-05 MED ORDER — CEFAZOLIN SODIUM-DEXTROSE 2-3 GM-% IV SOLR
2.0000 g | INTRAVENOUS | Status: AC
  Administered 2013-07-06: 2 g via INTRAVENOUS
  Filled 2013-07-05: qty 50

## 2013-07-05 NOTE — H&P (Signed)
> [address] [place], [place] [address] Phone: [phone]   Patient ID:   [id number] Patient: Charlotte [name]  Date of Birth: 1960/11/02 Visit Type: Office Visit   Date: 05/26/2013 08:30 AM Provider: Danae Orleans. Venetia Maxon   Historian: self  This 52 year old female presents for Follow Up of neck pain.  HISTORY OF PRESENT ILLNESS: 1.  Follow Up of neck pain   Pt returns with her husband to discuss MRI findings.  Cervical MRI & x-rays on [facility].  Patient is still complaining of severe right arm pain and upper neck pain.  This appears to be in the C4 distribution principally but also she has a C6 radiculopathy on the right with numbness into her right thumb.  She does not appear to focal weakness on confrontational testing.  She has a markedly positive Spurling maneuver to the right.  Her deltoid appears to be relatively spared in terms of symptomatology.   Medical/Surgical/Interim History  Reviewed, no change.  Last detailed document date:05/05/2013.    PAST MEDICAL HISTORY, SURGICAL HISTORY, FAMILY HISTORY, SOCIAL HISTORY AND REVIEW OF SYSTEMS I have reviewed the patient's past medical, surgical, family and social history as well as the comprehensive review of systems as included on the [facility] history form dated 05/05/2013, which I have signed.  Family History: Reviewed, no changes.  Last detailed document date:05/05/2013.   Social History: Reviewed, no changes. Last detailed document date: 05/05/2013.      Medications (added, continued or stopped this visit):   Medication Dose Prescribed Else Ind Started Stopped  Ambien CR 12.5 mg tablet,extended release 12.5 mg Y    citalopram 20 mg tablet 20 mg Y    Minivelle 0.1 mg/24 hr transdermal patch 0.1 mg/24 hour Y    Xanax 0.25 mg tablet 0.25 mg Y       Allergies:  Ingredient Reaction Medication Name Comment  LISINOPRIL     Reviewed, no changes.   Vitals Date  Temp F BP Pulse Ht In Wt Lb BMI BSA Pain Score  05/26/2013  133/84 61 69 169 24.96  2/10      DIAGNOSTIC RESULTS Diagnostic report text  CLINICAL DATA: Posterior neck pain and right arm pain. Prior fusion from C4 through C6.  EXAM: MRI CERVICAL SPINE WITHOUT CONTRAST  TECHNIQUE: Multiplanar, multisequence MR imaging was performed. No intravenous contrast was administered.  COMPARISON: Radiographs dated 05/05/2013 and MRI dated 01/31/2006  FINDINGS: The visualized intracranial contents are normal. Cervical spinal cord and paraspinal soft tissues are normal. No significant facet arthritis in the cervical spine.  C1-2 and C2-3: Normal.  C3-4: Severe degenerative disc disease with very prominent osteophytes protruding into the right lateral recess and right neural foramen. No soft disk protrusion. Osteophytes extend across the midline into the left lateral recess. These have progressed significantly since the prior study. I suspect the osteophytes on the right affect the right C4 nerve.  C4-5: Solid anterior fusion with no residual impingement. Widely patent neural foramina.  C5-6: The patient has undergone anterior cervical fusion procedure. There appears to be motion at that level on the recent flexion and extension radiographs but there is no disc protrusion or neural impingement. Widely patent neural foramina.  C6-7: Small broad-based disc bulge without neural impingement. Widely patent neural foramina.  C7-T1 and T1-2: No significant abnormality.  IMPRESSION: 1. Prominent osteophytes protruding in to the right lateral recess and right neural foramen at C3-4 which probably affects the right C4 nerve. Does  the patient have a C4 radiculopathy? 2. Nonunion of the attempted fusion at C5-6. However, there is no neural impingement. 3. Small broad-based disc bulge at C6-7 without neural impingement.   Electronically Signed By: Geanie Cooley On: 05/12/2013  14:27    IMPRESSION At this point the patient has a significant cervical spondylitic myelopathy compression at the C3-C4 level and a significant right C4 radiculopathy.  She also has evidence of a pseudoarthrosis at the C5-C6 level with symptomatic right C6 radiculopathy.  I have recommended that she undergo surgery.  The surgery will comprise of anterior cervical decompression and fusion at the C3-C4 level with removal of the previously placed anterior cervical plate from Z6-X0 level with confirmation of pseudarthrosis at C5-C6 with revision of C5-C6 pseudoarthrosis.  I plan either place a plate from R6-E4 levels were 2 small plates from V4-U9 and from C5-C6 levels.  She has some spondylosis at C6-C7 but does not appear to have significant cord compression or neural foraminal stenosis at this level.  Assessment/Plan # Detail Type Description   1. Assessment Cervical spondylosis w/ myelopathy (721.1).       2. Assessment Pseudoarthrosis of cervical spine (733.82).       3. Assessment Cervical spinal stenosis (723.0).       4. Assessment Pain/neck (723.1).        Patient wishes to proceed with surgery on 1118 2014.  The prior plate was a tiger plate.  This will be removed at the time of her surgery.  She is fitted for a distal collar today we spent in several time discussing surgery and tennis and benefits and answering her husband's questions.  They wish to proceed.  Orders: Diagnostic Procedures: Assessment Procedure  721.1 ACDF - C3-C4, removal hardware c-6 with revision acdf at c56             Provider:  Danae Orleans. Venetia Maxon  05/26/2013 11:21 AM Dictation edited by: Danae Orleans. Venetia Maxon    CC Providers: Maeola Harman [address] [place], [place] [address] ----------------------------------------------------------------------------------------------------------------------------------------------------------------------         Electronically signed by Danae Orleans. Venetia Maxon on  05/26/2013 11:21 AM  > [address] [place] [address] [place], [place] [id number] Phone: [phone]   Patient ID:   [id number] Patient: Charlotte [name]  Date of Birth: 1960/09/08 Visit Type: Office Visit   Date: 05/05/2013 09:15 AM Provider: Danae Orleans. Venetia Maxon   Historian: self  This 52 year old female presents for neck pain.  HISTORY OF PRESENT ILLNESS: 1.  neck pain   Patient states right sided neck, shoulder, and arm pain with numbness.   She also states frequent headaches.   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 Naprosyn & Robaxin only prn severe discomfort.  Hx: ACDF C4-5, C5-23 January 2003 by Dr. Channing Mutters        HTN corrected by 40lb wt loss  X-rays on [facility] today.  Last MRI 3 yrs ago at ?Marland Kitchen..'07 study on Canopy  X-rays obtained today demonstrate what appears to be a solid arthrodesis at the C45 level with abnormal motion at C5-C6 with appears to represent a pseudoarthrosis at this level.  There is a broken screw at C5 consistent with pseudoarthrosis.  She also has significant spondylosis and posterior spurring at the C3-C4 level and some mild or disc degeneration at C6-C7.  Currently the patient is complaining of right shoulder pain radiating into her right thumb which is increased over  last 2 years.  She does not feel that she is weak in her right arm.  She does not complain of any left arm or lower extremity complaints.  In addition to neck surgery, which was performed by Dr. Deniece Portela 2004 she has had bilateral carpal tunnel release.  Patient states that prior to her neck surgeries she had a significant degree of spinal cord compression and had a cervical myelopathy.  This is affecting her cervical spinal cord at both the C4-C5 and C5-C6 levels.  She had complete relief following the surgery of her myelopathic symptoms and also radicular complaints until they have insidiously recurred starting  approximately 2 years ago.     PAST MEDICAL/SURGICAL HISTORY  (Detailed)  Disease/disorder Onset Date Management Date Comments    Carpal tunnel release      Spinal fusion, cervical      LASIK    Depression      Headache, migraine      Hypertension          PAST MEDICAL HISTORY, SURGICAL HISTORY, FAMILY HISTORY, SOCIAL HISTORY AND REVIEW OF SYSTEMS I have reviewed the patient's past medical, surgical, family and social history as well as the comprehensive review of systems as included on the [facility] history form dated 05/05/2013, which I have signed.  Family History  (Detailed)  Relationship Family Member Name Deceased Age at Death Condition Onset Age Cause of Death  Father    Hypertension  N  Father    Diabetes mellitus  N  Father    CABG  N  Mother    Arthritis  N  Mother    Atrial fibrillation  N   SOCIAL HISTORY  (Detailed) Tobacco use reviewed. Preferred language is Unknown.   Smoking status: Never smoker.  SMOKING STATUS Use Status Type Smoking Status Usage Per Day Years Used Total Pack Years  no/never  Never smoker             Medications (added, continued or stopped this visit):   Medication Dose Prescribed Else Ind Started Stopped  Ambien CR 12.5 mg tablet,extended release 12.5 mg Y    citalopram 20 mg tablet 20 mg Y    Minivelle 0.1 mg/24 hr transdermal patch 0.1 mg/24 hour Y    Xanax 0.25 mg tablet 0.25 mg Y       Allergies:  Ingredient Reaction Medication Name Comment  LISINOPRIL     New allergies added during this encounter. Active list above.  REVIEW OF SYSTEMS: System Neg/Pos Details  Constitutional Negative Chills, fatigue, fever, malaise, night sweats, weight gain and weight loss.  ENMT Negative Ear drainage, hearing loss, nasal drainage, otalgia, sinus pressure and sore throat.  Eyes Negative Eye discharge, eye pain and vision changes.  Respiratory Negative Chronic cough, cough, dyspnea, known TB  exposure and wheezing.  Cardio Negative Chest pain, claudication, edema and irregular heartbeat/palpitations.  GI Negative Abdominal pain, blood in stool, change in stool pattern, constipation, decreased appetite, diarrhea, heartburn, nausea and vomiting.  GU Negative Dysuria, hematuria, hot flashes, irregular menses, polyuria, urinary frequency, urinary incontinence and urinary retention.  Endocrine Negative Cold intolerance, heat intolerance, polydipsia and polyphagia.  Neuro Negative Dizziness, extremity weakness, gait disturbance, headache, memory impairment, numbness in extremities, seizures and tremors.  Psych Negative Anxiety, depression and insomnia.  Integumentary Negative Brittle hair, brittle nails, change in shape/size of mole(s), hair loss, hirsutism, hives, pruritus, rash and skin lesion.  MS Positive Neck pain, RUE pain/numbness.  Hema/Lymph Negative Easy bleeding,  easy bruising and lymphadenopathy.  Allergic/Immuno Negative Contact allergy, environmental allergies, food allergies and seasonal allergies.  Reproductive Negative Breast discharge, breast lump(s), dysmenorrhea, dyspareunia, history of abnormal PAP smear and vaginal discharge.    Vitals Date Temp F BP Pulse Ht In Wt Lb BMI BSA Pain Score  05/05/2013  145/92 66 69 169 24.96  2/10     PHYSICAL EXAM General Level of Distress: no acute distress Overall Appearance: normal    Cardiovascular Cardiac: regular rate and rhythm without murmur  Right Left  Carotid Pulses: normal normal  Respiratory Lungs: clear to auscultation  Neurological Orientation: normal Recent and Remote Memory: normal Attention Span and Concentration:   normal Language: normal Fund of Knowledge: normal  Right Left Sensation: normal normal Upper Extremity Coordination: normal normal  Lower Extremity Coordination: normal normal  Musculoskeletal Gait and Station: normal  Right Left Upper Extremity Muscle  Strength: normal normal Lower Extremity Muscle Strength: normal normal Upper Extremity Muscle Tone:  normal normal Lower Extremity Muscle Tone: normal normal  Motor Strength Upper and lower extremity motor strength was tested in the clinically pertinent muscles.   Deep Tendon Reflexes  Right Left Biceps: increase increase Triceps: increase increase Brachiloradialis: increase increase Patellar: increase increase Achilles: increase increase  Sensory Sensation was tested at C2 to T1 .Any abnormal findings will be noted below..  Right Left C6: decreased   Cranial Nerves II. Optic Nerve/Visual Fields: normal III. Oculomotor: normal IV. Trochlear: normal V. Trigeminal: normal VI. Abducens: normal VII. Facial: normal VIII. Acoustic/Vestibular: normal IX. Glossopharyngeal: normal X. Vagus: normal XI. Spinal Accessory: normal XII. Hypoglossal: normal  Motor and other Tests Lhermittes: negative Rhomberg: negative Pronator drift: absent     Right Left Spurlings positive negative Hoffman's: normal normal Clonus: normal normal Babinski: normal normal SLR: negative negative Patrick's Pearlean Brownie): negative negative Toe Walk: normal normal Toe Lift: normal normal Heel Walk: normal normal Tinels Elbow: negative negative Tinels Wrist: negative negative Phalen: negative negative   Additional Findings:  Patient has hyperreflexia in both lower extremities with positive suprapatellar and crossed adductor reflexes at the knees.  She has a positive Spurling maneuver to the right.  She is dysesthetic pain into her right leg.  She describes this is similar to her preoperative myelopathy symptoms before her neck surgery in 2004.    DIAGNOSTIC RESULTS X-rays obtained today demonstrate what appears to be a solid arthrodesis at the C45 level with abnormal motion at C5-C6 with appears to represent a pseudoarthrosis at this level.  There is a broken screw at C5 consistent with  pseudoarthrosis.  She also has significant spondylosis and posterior spurring at the C3-C4 level and some mild or disc degeneration at C6-C7.    IMPRESSION Patient has evidence of an radiographs of the cervical spine at the C5-C6 pseudoarthrosis.  Additionally she appears to have a large posteriorly projecting osteophyte at the C3-C4 level which may be contributing to new cord compression symptoms.  She is hyperreflexic in the lower and upper extremities and has clear evidence of radicular pain on the right with a positive Spurling maneuver and dysesthetic pain into her right thumb.  She is also complaining of right leg dysesthesias and notes that this is similar to her preoperative status with the cord compression for which she resulted in surgery in 2004.  Assessment/Plan # Detail Type Description   1. Assessment Pain/neck (723.1).       2. Assessment Cervical spinal stenosis (723.0).       3. Assessment Pseudoarthrosis of cervical  spine (733.82).       4. Assessment Cervical spondylosis w/ myelopathy (721.1).        MRI of cervical spine and return visit with me to evaluate these images.  Orders: Diagnostic Procedures: Assessment Procedure  721.1 MRI Spinal/cerv W/o Contrast  721.1 Return to Clinic after study is performed             Provider:  Danae Orleans. Venetia Maxon  05/05/2013 11:47 AM Dictation edited by: Danae Orleans. Venetia Maxon    CC Providers: Maeola Harman [address] [place], [place] [address] ----------------------------------------------------------------------------------------------------------------------------------------------------------------------         Electronically signed by Danae Orleans. Venetia Maxon on 05/05/2013 11:47 AM

## 2013-07-06 ENCOUNTER — Ambulatory Visit (HOSPITAL_COMMUNITY): Payer: 59

## 2013-07-06 ENCOUNTER — Observation Stay (HOSPITAL_COMMUNITY)
Admission: RE | Admit: 2013-07-06 | Discharge: 2013-07-07 | Disposition: A | Discharge status: Home / Self Care | Payer: 59 | Source: Ambulatory Visit | Attending: Neurosurgery | Admitting: Neurosurgery

## 2013-07-06 ENCOUNTER — Encounter (HOSPITAL_COMMUNITY): Payer: 59 | Admitting: Certified Registered"

## 2013-07-06 ENCOUNTER — Encounter (HOSPITAL_COMMUNITY)
Admission: RE | Disposition: A | Discharge status: Home / Self Care | Payer: Self-pay | Source: Ambulatory Visit | Attending: Neurosurgery

## 2013-07-06 ENCOUNTER — Encounter (HOSPITAL_COMMUNITY): Payer: Self-pay | Admitting: Certified Registered"

## 2013-07-06 ENCOUNTER — Ambulatory Visit (HOSPITAL_COMMUNITY): Payer: 59 | Admitting: Certified Registered"

## 2013-07-06 DIAGNOSIS — M4712 Other spondylosis with myelopathy, cervical region: Secondary | ICD-10-CM | POA: Insufficient documentation

## 2013-07-06 DIAGNOSIS — Y832 Surgical operation with anastomosis, bypass or graft as the cause of abnormal reaction of the patient, or of later complication, without mention of misadventure at the time of the procedure: Secondary | ICD-10-CM | POA: Insufficient documentation

## 2013-07-06 DIAGNOSIS — M5000 Cervical disc disorder with myelopathy, unspecified cervical region: Secondary | ICD-10-CM | POA: Insufficient documentation

## 2013-07-06 DIAGNOSIS — T84498A Other mechanical complication of other internal orthopedic devices, implants and grafts, initial encounter: Principal | ICD-10-CM | POA: Insufficient documentation

## 2013-07-06 HISTORY — PX: ANTERIOR CERVICAL DECOMP/DISCECTOMY FUSION: SHX1161

## 2013-07-06 SURGERY — ANTERIOR CERVICAL DECOMPRESSION/DISCECTOMY FUSION 2 LEVEL/HARDWARE REMOVAL
Anesthesia: General | Site: Neck | Wound class: Clean

## 2013-07-06 MED ORDER — ZOLPIDEM TARTRATE 5 MG PO TABS: 5.0000 mg | ORAL_TABLET | Freq: Every evening | ORAL | Status: DC | PRN

## 2013-07-06 MED ORDER — ROCURONIUM BROMIDE 100 MG/10ML IV SOLN
INTRAVENOUS | Status: DC | PRN
  Administered 2013-07-06: 50 mg via INTRAVENOUS

## 2013-07-06 MED ORDER — SODIUM CHLORIDE 0.9 % IV SOLN: 250.0000 mL | INTRAVENOUS | Status: DC

## 2013-07-06 MED ORDER — DOCUSATE SODIUM 100 MG PO CAPS
100.0000 mg | ORAL_CAPSULE | Freq: Two times a day (BID) | ORAL | Status: DC
  Administered 2013-07-06: 100 mg via ORAL
  Filled 2013-07-06 (×4): qty 1

## 2013-07-06 MED ORDER — DIAZEPAM 5 MG PO TABS
5.0000 mg | ORAL_TABLET | Freq: Four times a day (QID) | ORAL | Status: DC | PRN
  Administered 2013-07-06 – 2013-07-07 (×4): 5 mg via ORAL
  Filled 2013-07-06 (×3): qty 1

## 2013-07-06 MED ORDER — HYDROMORPHONE HCL PF 1 MG/ML IJ SOLN
0.2500 mg | INTRAMUSCULAR | Status: DC | PRN
  Administered 2013-07-06 (×4): 0.5 mg via INTRAVENOUS

## 2013-07-06 MED ORDER — ACETAMINOPHEN 650 MG RE SUPP: 650.0000 mg | RECTAL | Status: DC | PRN

## 2013-07-06 MED ORDER — ONDANSETRON HCL 4 MG/2ML IJ SOLN: 4.0000 mg | INTRAMUSCULAR | Status: DC | PRN

## 2013-07-06 MED ORDER — OXYCODONE HCL 5 MG PO TABS
5.0000 mg | ORAL_TABLET | Freq: Once | ORAL | Status: AC | PRN
  Administered 2013-07-06: 5 mg via ORAL

## 2013-07-06 MED ORDER — PHENOL 1.4 % MT LIQD: 1.0000 | OROMUCOSAL | Status: DC | PRN

## 2013-07-06 MED ORDER — LIDOCAINE HCL (CARDIAC) 20 MG/ML IV SOLN
INTRAVENOUS | Status: DC | PRN
  Administered 2013-07-06: 80 mg via INTRAVENOUS

## 2013-07-06 MED ORDER — DIAZEPAM 5 MG PO TABS
ORAL_TABLET | ORAL | Status: AC
  Filled 2013-07-06: qty 1

## 2013-07-06 MED ORDER — SUFENTANIL CITRATE 50 MCG/ML IV SOLN
INTRAVENOUS | Status: DC | PRN
  Administered 2013-07-06: 5 ug via INTRAVENOUS
  Administered 2013-07-06: 20 ug via INTRAVENOUS

## 2013-07-06 MED ORDER — PHENYLEPHRINE HCL 10 MG/ML IJ SOLN
INTRAMUSCULAR | Status: DC | PRN
  Administered 2013-07-06 (×3): 80 ug via INTRAVENOUS
  Administered 2013-07-06: 40 ug via INTRAVENOUS

## 2013-07-06 MED ORDER — OXYCODONE-ACETAMINOPHEN 5-325 MG PO TABS
1.0000 | ORAL_TABLET | ORAL | Status: DC | PRN
  Administered 2013-07-06: 2 via ORAL
  Administered 2013-07-06: 1 via ORAL
  Administered 2013-07-07: 2 via ORAL
  Filled 2013-07-06 (×2): qty 2
  Filled 2013-07-06: qty 1

## 2013-07-06 MED ORDER — HYDROMORPHONE HCL PF 1 MG/ML IJ SOLN
INTRAMUSCULAR | Status: AC
  Filled 2013-07-06: qty 1

## 2013-07-06 MED ORDER — ALUM & MAG HYDROXIDE-SIMETH 200-200-20 MG/5ML PO SUSP: 30.0000 mL | Freq: Four times a day (QID) | ORAL | Status: DC | PRN

## 2013-07-06 MED ORDER — MENTHOL 3 MG MT LOZG
1.0000 | LOZENGE | OROMUCOSAL | Status: DC | PRN
  Administered 2013-07-06 – 2013-07-07 (×3): 3 mg via ORAL
  Filled 2013-07-06 (×3): qty 9

## 2013-07-06 MED ORDER — SODIUM CHLORIDE 0.9 % IJ SOLN
3.0000 mL | Freq: Two times a day (BID) | INTRAMUSCULAR | Status: DC
  Administered 2013-07-06: 3 mL via INTRAVENOUS

## 2013-07-06 MED ORDER — HYDROCODONE-ACETAMINOPHEN 5-325 MG PO TABS: 1.0000 | ORAL_TABLET | ORAL | Status: DC | PRN

## 2013-07-06 MED ORDER — DEXAMETHASONE SODIUM PHOSPHATE 4 MG/ML IJ SOLN
INTRAMUSCULAR | Status: DC | PRN
  Administered 2013-07-06: 10 mg via INTRAVENOUS

## 2013-07-06 MED ORDER — OXYCODONE HCL 5 MG PO TABS
ORAL_TABLET | ORAL | Status: AC
  Filled 2013-07-06: qty 1

## 2013-07-06 MED ORDER — BUPIVACAINE HCL (PF) 0.5 % IJ SOLN
INTRAMUSCULAR | Status: DC | PRN
  Administered 2013-07-06: 3 mL

## 2013-07-06 MED ORDER — SENNA 8.6 MG PO TABS
1.0000 | ORAL_TABLET | Freq: Two times a day (BID) | ORAL | Status: DC
  Administered 2013-07-06: 8.6 mg via ORAL
  Filled 2013-07-06 (×3): qty 1

## 2013-07-06 MED ORDER — CEFAZOLIN SODIUM 1-5 GM-% IV SOLN
1.0000 g | Freq: Three times a day (TID) | INTRAVENOUS | Status: AC
  Administered 2013-07-06 (×2): 1 g via INTRAVENOUS
  Filled 2013-07-06 (×2): qty 50

## 2013-07-06 MED ORDER — LACTATED RINGERS IV SOLN
INTRAVENOUS | Status: DC | PRN
  Administered 2013-07-06 (×2): via INTRAVENOUS

## 2013-07-06 MED ORDER — ACETAMINOPHEN 325 MG PO TABS: 650.0000 mg | ORAL_TABLET | ORAL | Status: DC | PRN

## 2013-07-06 MED ORDER — SODIUM CHLORIDE 0.9 % IJ SOLN: 3.0000 mL | INTRAMUSCULAR | Status: DC | PRN

## 2013-07-06 MED ORDER — FLEET ENEMA 7-19 GM/118ML RE ENEM
1.0000 | ENEMA | Freq: Once | RECTAL | Status: AC | PRN
  Filled 2013-07-06: qty 1

## 2013-07-06 MED ORDER — ESTRADIOL 0.1 MG/24HR TD PTTW
0.1000 mg | MEDICATED_PATCH | TRANSDERMAL | Status: DC
  Filled 2013-07-06: qty 1

## 2013-07-06 MED ORDER — POLYETHYLENE GLYCOL 3350 17 G PO PACK
17.0000 g | PACK | Freq: Every day | ORAL | Status: DC | PRN
  Filled 2013-07-06: qty 1

## 2013-07-06 MED ORDER — ONDANSETRON HCL 4 MG/2ML IJ SOLN
INTRAMUSCULAR | Status: DC | PRN
  Administered 2013-07-06: 4 mg via INTRAVENOUS

## 2013-07-06 MED ORDER — MORPHINE SULFATE 2 MG/ML IJ SOLN
1.0000 mg | INTRAMUSCULAR | Status: DC | PRN
  Administered 2013-07-06 (×2): 2 mg via INTRAVENOUS
  Administered 2013-07-07: 4 mg via INTRAVENOUS
  Filled 2013-07-06 (×4): qty 1

## 2013-07-06 MED ORDER — THROMBIN 5000 UNITS EX SOLR
CUTANEOUS | Status: DC | PRN
  Administered 2013-07-06 (×2): 5000 [IU] via TOPICAL

## 2013-07-06 MED ORDER — ONDANSETRON HCL 4 MG/2ML IJ SOLN: 4.0000 mg | Freq: Four times a day (QID) | INTRAMUSCULAR | Status: DC | PRN

## 2013-07-06 MED ORDER — PROPOFOL 10 MG/ML IV BOLUS
INTRAVENOUS | Status: DC | PRN
  Administered 2013-07-06: 170 mg via INTRAVENOUS

## 2013-07-06 MED ORDER — OXYCODONE HCL 5 MG/5ML PO SOLN: 5.0000 mg | Freq: Once | ORAL | Status: AC | PRN

## 2013-07-06 MED ORDER — MIDAZOLAM HCL 5 MG/5ML IJ SOLN
INTRAMUSCULAR | Status: DC | PRN
  Administered 2013-07-06: 2 mg via INTRAVENOUS

## 2013-07-06 MED ORDER — ALPRAZOLAM 0.25 MG PO TABS: 0.2500 mg | ORAL_TABLET | Freq: Every evening | ORAL | Status: DC | PRN

## 2013-07-06 MED ORDER — CITALOPRAM HYDROBROMIDE 20 MG PO TABS
20.0000 mg | ORAL_TABLET | Freq: Every day | ORAL | Status: DC
  Administered 2013-07-06: 20 mg via ORAL
  Filled 2013-07-06 (×2): qty 1

## 2013-07-06 MED ORDER — HEMOSTATIC AGENTS (NO CHARGE) OPTIME
TOPICAL | Status: DC | PRN
  Administered 2013-07-06: 1 via TOPICAL

## 2013-07-06 MED ORDER — BISACODYL 10 MG RE SUPP: 10.0000 mg | Freq: Every day | RECTAL | Status: DC | PRN

## 2013-07-06 MED ORDER — PANTOPRAZOLE SODIUM 40 MG IV SOLR
40.0000 mg | Freq: Every day | INTRAVENOUS | Status: DC
  Administered 2013-07-06: 40 mg via INTRAVENOUS
  Filled 2013-07-06 (×2): qty 40

## 2013-07-06 MED ORDER — LIDOCAINE-EPINEPHRINE 1 %-1:100000 IJ SOLN
INTRAMUSCULAR | Status: DC | PRN
  Administered 2013-07-06: 3 mL

## 2013-07-06 MED ORDER — KCL IN DEXTROSE-NACL 20-5-0.45 MEQ/L-%-% IV SOLN
INTRAVENOUS | Status: DC
  Filled 2013-07-06 (×3): qty 1000

## 2013-07-06 SURGICAL SUPPLY — 77 items
ADH SKN CLS APL DERMABOND .7 (GAUZE/BANDAGES/DRESSINGS) ×1
ADH SKN CLS LQ APL DERMABOND (GAUZE/BANDAGES/DRESSINGS) ×1
APL SKNCLS STERI-STRIP NONHPOA (GAUZE/BANDAGES/DRESSINGS) ×1
BAG DECANTER FOR FLEXI CONT (MISCELLANEOUS) ×2 IMPLANT
BANDAGE GAUZE ELAST BULKY 4 IN (GAUZE/BANDAGES/DRESSINGS) IMPLANT
BENZOIN TINCTURE PRP APPL 2/3 (GAUZE/BANDAGES/DRESSINGS) ×1 IMPLANT
BIT DRILL 14MM (INSTRUMENTS) IMPLANT
BIT DRILL NEURO 2X3.1 SFT TUCH (MISCELLANEOUS) ×1 IMPLANT
BLADE SURG 15 STRL LF DISP TIS (BLADE) IMPLANT
BLADE SURG 15 STRL SS (BLADE) ×2
BLADE ULTRA TIP 2M (BLADE) IMPLANT
BUR BARREL STRAIGHT FLUTE 4.0 (BURR) ×2 IMPLANT
CANISTER SUCT 3000ML (MISCELLANEOUS) ×2 IMPLANT
CONT SPEC 4OZ CLIKSEAL STRL BL (MISCELLANEOUS) ×2 IMPLANT
COVER MAYO STAND STRL (DRAPES) ×2 IMPLANT
DERMABOND ADHESIVE PROPEN (GAUZE/BANDAGES/DRESSINGS) ×1
DERMABOND ADVANCED (GAUZE/BANDAGES/DRESSINGS) ×1
DERMABOND ADVANCED .7 DNX12 (GAUZE/BANDAGES/DRESSINGS) ×1 IMPLANT
DERMABOND ADVANCED .7 DNX6 (GAUZE/BANDAGES/DRESSINGS) IMPLANT
DRAPE C-ARM 42X72 X-RAY (DRAPES) ×2 IMPLANT
DRAPE LAPAROTOMY 100X72 PEDS (DRAPES) ×2 IMPLANT
DRAPE MICROSCOPE LEICA (MISCELLANEOUS) ×2 IMPLANT
DRAPE POUCH INSTRU U-SHP 10X18 (DRAPES) ×2 IMPLANT
DRAPE PROXIMA HALF (DRAPES) IMPLANT
DRESSING TELFA 8X3 (GAUZE/BANDAGES/DRESSINGS) IMPLANT
DRILL 14MM (INSTRUMENTS) ×2
DRILL NEURO 2X3.1 SOFT TOUCH (MISCELLANEOUS) ×2
DURAPREP 6ML APPLICATOR 50/CS (WOUND CARE) ×2 IMPLANT
ELECT COATED BLADE 2.86 ST (ELECTRODE) ×2 IMPLANT
ELECT REM PT RETURN 9FT ADLT (ELECTROSURGICAL) ×2
ELECTRODE REM PT RTRN 9FT ADLT (ELECTROSURGICAL) ×1 IMPLANT
GAUZE SPONGE 4X4 16PLY XRAY LF (GAUZE/BANDAGES/DRESSINGS) IMPLANT
GLOVE BIO SURGEON STRL SZ8 (GLOVE) ×2 IMPLANT
GLOVE BIOGEL PI IND STRL 8 (GLOVE) ×1 IMPLANT
GLOVE BIOGEL PI IND STRL 8.5 (GLOVE) ×1 IMPLANT
GLOVE BIOGEL PI INDICATOR 8 (GLOVE) ×1
GLOVE BIOGEL PI INDICATOR 8.5 (GLOVE) ×1
GLOVE ECLIPSE 7.5 STRL STRAW (GLOVE) ×2 IMPLANT
GLOVE ECLIPSE 8.0 STRL XLNG CF (GLOVE) ×1 IMPLANT
GLOVE EXAM NITRILE LRG STRL (GLOVE) IMPLANT
GLOVE EXAM NITRILE MD LF STRL (GLOVE) IMPLANT
GLOVE EXAM NITRILE XL STR (GLOVE) IMPLANT
GLOVE EXAM NITRILE XS STR PU (GLOVE) IMPLANT
GOWN BRE IMP SLV AUR LG STRL (GOWN DISPOSABLE) ×2 IMPLANT
GOWN BRE IMP SLV AUR XL STRL (GOWN DISPOSABLE) ×1 IMPLANT
GOWN STRL REIN 2XL LVL4 (GOWN DISPOSABLE) ×1 IMPLANT
HEAD HALTER (SOFTGOODS) ×2 IMPLANT
KIT BASIN OR (CUSTOM PROCEDURE TRAY) ×2 IMPLANT
KIT ROOM TURNOVER OR (KITS) ×2 IMPLANT
NDL HYPO 18GX1.5 BLUNT FILL (NEEDLE) ×1 IMPLANT
NDL HYPO 25X1 1.5 SAFETY (NEEDLE) ×1 IMPLANT
NDL SPNL 22GX3.5 QUINCKE BK (NEEDLE) ×1 IMPLANT
NEEDLE HYPO 18GX1.5 BLUNT FILL (NEEDLE) ×2 IMPLANT
NEEDLE HYPO 25X1 1.5 SAFETY (NEEDLE) ×2 IMPLANT
NEEDLE SPNL 22GX3.5 QUINCKE BK (NEEDLE) ×2 IMPLANT
NS IRRIG 1000ML POUR BTL (IV SOLUTION) ×2 IMPLANT
PACK LAMINECTOMY NEURO (CUSTOM PROCEDURE TRAY) ×2 IMPLANT
PAD ARMBOARD 7.5X6 YLW CONV (MISCELLANEOUS) ×2 IMPLANT
PIN DISTRACTION 14MM (PIN) ×4 IMPLANT
PLATE 14MM (Plate) ×1 IMPLANT
PLATE 16MM (Plate) ×1 IMPLANT
RUBBERBAND STERILE (MISCELLANEOUS) ×4 IMPLANT
SCREW 14MM (Screw) ×10 IMPLANT
SCREW BN 14X4.5XST VA (Screw) IMPLANT
SPACER ASSEM CERV LORD 6M (Spacer) ×2 IMPLANT
SPONGE GAUZE 4X4 12PLY (GAUZE/BANDAGES/DRESSINGS) IMPLANT
SPONGE INTESTINAL PEANUT (DISPOSABLE) ×2 IMPLANT
SPONGE SURGIFOAM ABS GEL 100 (HEMOSTASIS) IMPLANT
STAPLER SKIN PROX WIDE 3.9 (STAPLE) IMPLANT
STRIP CLOSURE SKIN 1/2X4 (GAUZE/BANDAGES/DRESSINGS) IMPLANT
SUT VIC AB 3-0 SH 8-18 (SUTURE) ×2 IMPLANT
SYR 20ML ECCENTRIC (SYRINGE) ×2 IMPLANT
SYR 3ML LL SCALE MARK (SYRINGE) ×2 IMPLANT
TOWEL OR 17X24 6PK STRL BLUE (TOWEL DISPOSABLE) ×2 IMPLANT
TOWEL OR 17X26 10 PK STRL BLUE (TOWEL DISPOSABLE) ×2 IMPLANT
TRAP SPECIMEN MUCOUS 40CC (MISCELLANEOUS) IMPLANT
WATER STERILE IRR 1000ML POUR (IV SOLUTION) ×2 IMPLANT

## 2013-07-06 NOTE — Preoperative (Signed)
Beta Blockers   Reason not to administer Beta Blockers:Not Applicable 

## 2013-07-06 NOTE — Interval H&P Note (Signed)
History and Physical Interval Note:  07/06/2013 7:41 AM  Charlotte [name]  has presented today for surgery, with the diagnosis of Cervical spondylosis with myelopathy  The various methods of treatment have been discussed with the patient and family. After consideration of risks, benefits and other options for treatment, the patient has consented to  Procedure(s) with comments: ANTERIOR CERVICAL DECOMPRESSION/DISCECTOMY FUSION 2 LEVEL/HARDWARE REMOVAL (N/A) - C3-4 Anterior cervical decompression/diskectomy/fusion/removal of hardware at C6 with revision of Anterior cervical fusion at C5-6 as a surgical intervention .  The patient's history has been reviewed, patient examined, no change in status, stable for surgery.  I have reviewed the patient's chart and labs.  Questions were answered to the patient's satisfaction.     Balen Woolum D

## 2013-07-06 NOTE — Anesthesia Procedure Notes (Signed)
Procedure Name: Intubation Date/Time: 07/06/2013 7:53 AM Performed by: Charm Barges, Jayonna Meyering R Pre-anesthesia Checklist: Patient identified, Emergency Drugs available, Suction available, Patient being monitored and Timeout performed Patient Re-evaluated:Patient Re-evaluated prior to inductionOxygen Delivery Method: Circle system utilized Preoxygenation: Pre-oxygenation with 100% oxygen Intubation Type: IV induction Ventilation: Mask ventilation without difficulty Laryngoscope Size: Mac and 3 Grade View: Grade II Tube type: Oral Tube size: 7.5 mm Number of attempts: 1 Airway Equipment and Method: Stylet Placement Confirmation: ETT inserted through vocal cords under direct vision,  positive ETCO2 and breath sounds checked- equal and bilateral Secured at: 21 cm Tube secured with: Tape Dental Injury: Teeth and Oropharynx as per pre-operative assessment

## 2013-07-06 NOTE — Op Note (Signed)
07/06/2013  10:26 AM  PATIENT:  Charlotte [name]  52 y.o. female  PRE-OPERATIVE DIAGNOSIS:  Cervical spondylosis with myelopathy C 34, pseudoarthrosis C 56, spondylosis, neck pain, radiculopathy   POST-OPERATIVE DIAGNOSIS:  Cervical spondylosis with myelopathy C 34, pseudoarthrosis C 56, spondylosis, neck pain, radiculopathy   PROCEDURE:  Procedure(s) with comments: ANTERIOR CERVICAL DECOMPRESSION/DISCECTOMY FUSION 2 LEVEL/HARDWARE REMOVAL (N/A) - C3-4 Anterior cervical decompression/diskectomy/fusion/with exploration of fusion and removal of hardware at C 4 through C 6 levels with revision of pseudoarthrosis at C5-6 with allograft/autograft and plating C 3- 4 and C 5 -6 levels  SURGEON:  Surgeon(s) and Role:    * Arcadia Gorgas, MD - Primary    * Randy O Kritzer, MD - Assisting  PHYSICIAN ASSISTANT:   ASSISTANTS: Poteat, RN   ANESTHESIA:   general  EBL:  Total I/O In: 1650 [I.V.:1650] Out: 75 [Blood:75]  BLOOD ADMINISTERED:none  DRAINS: none   LOCAL MEDICATIONS USED:  LIDOCAINE   SPECIMEN:  No Specimen  DISPOSITION OF SPECIMEN:  N/A  COUNTS:  YES  TOURNIQUET:  * No tourniquets in log *  DICTATION: Patient is 52 year old female with right arm pain and weakness with HNP, spondylosis with myelopathy, radiculopathy with pseudoarthrosis at C 56 and disc herniation with myelopathy at C 34 levels.  Patient has plate at C 4 - C 6 levels.  PROCEDURE: Patient was brought to operating room and following the smooth and uncomplicated induction of general endotracheal anesthesia her head was placed on a horseshoe head holder she was placed in 5 pounds of Holter traction and her anterior neck was prepped and draped in usual sterile fashion. Her previous incision was reopened on the left side of midline after infiltrating the skin and subcutaneous tissues with local lidocaine. The platysmal layer was incised and subplatysmal dissection was performed exposing the anterior border  sternocleidomastoid muscle. Using blunt dissection the carotid sheath was kept lateral and trachea and esophagus kept medial exposing the anterior cervical spine. Careful dissection was performed through dense scar and the previously placed Tether anterior cervical plate was removed.  3 of 5 screws were broken.  The distal screw tips were left in place.  The C 56 level was explored and there was motion and obvious pseudoarthrosis at this level.  The fibrous union was dissected and removed with high speed drill under the operating microscope and the spinal cord dura and C 6 nerve roots were decompressed bilaterally. Distraction pins were placed.  Uncinate spurs and central spondylitic ridges were drilled down with a high-speed drill. The spinal cord dura and both C 6 nerve roots were widely decompressed. Hemostasis was assured. After trial sizing a 6 mm allograft interbody graft was selected and packed with autograft. This was tamped into position and countersunk appropriately. A 16 mm Trestle anterior cervical plate was placed using C arm fluoroscopy to help avoid retained screws.  14 mm  Screws were placed, 2 in C 5 and 2 in C 6. Attention was then paid to the C 34 level, where similar decompression was performed.  Uncinate spurs and central spondylitic ridges were drilled down with a high-speed drill. The spinal cord dura and both C4 nerve roots were widely decompressed. Large spondylotic spurs were removed and the spinal cord thoroughly decompressed. Hemostasis was assured. After trial sizing a 6 mm machined allograft bone wedge was selected and packed with autograft. This was tamped into position and countersunk appropriately. Distraction weight was removed. A 14 mm trestle luxe anterior cervical   plate was affixed to the cervical spine with 14 mm variable-angle screws 2 at C 3, 2 at C 4. All screws were well-positioned and locking mechanisms were engaged. A final X ray was obtained which showed well positioned  grafts and anterior plate without complicating features. Soft tissues were inspected and found to be in good repair. The wound was irrigated. The platysma layer was closed with 3-0 Vicryl stitches and the skin was reapproximated with 3-0 Vicryl subcuticular stitches. The wound was dressed with Dermabond. Counts were correct at the end of the case. Patient was extubated and taken to recovery in stable and satisfactory condition.    PLAN OF CARE: Admit for overnight observation  PATIENT DISPOSITION:  PACU - hemodynamically stable.   Delay start of Pharmacological VTE agent (>24hrs) due to surgical blood loss or risk of bleeding: yes  

## 2013-07-06 NOTE — Anesthesia Preprocedure Evaluation (Addendum)
Anesthesia Evaluation  Patient identified by MRN, date of birth, ID band Patient awake    Reviewed: Allergy & Precautions, H&P , NPO status , Patient's Chart, lab work & pertinent test results  Airway Mallampati: I TM Distance: >3 FB Neck ROM: Limited    Dental  (+) Teeth Intact and Dental Advisory Given   Pulmonary neg pulmonary ROS,          Cardiovascular hypertension, negative cardio ROS      Neuro/Psych Anxiety    GI/Hepatic GERD-  Controlled,  Endo/Other    Renal/GU      Musculoskeletal  (+) Arthritis -,   Abdominal   Peds  Hematology   Anesthesia Other Findings   Reproductive/Obstetrics                          Anesthesia Physical Anesthesia Plan  ASA: II  Anesthesia Plan: General   Post-op Pain Management:    Induction: Intravenous  Airway Management Planned: Oral ETT  Additional Equipment:   Intra-op Plan:   Post-operative Plan: Extubation in OR  Informed Consent: I have reviewed the patients History and Physical, chart, labs and discussed the procedure including the risks, benefits and alternatives for the proposed anesthesia with the patient or authorized representative who has indicated his/her understanding and acceptance.   Dental advisory given  Plan Discussed with: CRNA, Anesthesiologist and Surgeon  Anesthesia Plan Comments:        Anesthesia Quick Evaluation

## 2013-07-06 NOTE — Progress Notes (Signed)
Awake, alert, conversant.  Full strength of both upper and lower extremities.  Right arm pain much improved. Doing well.

## 2013-07-06 NOTE — Anesthesia Postprocedure Evaluation (Signed)
Anesthesia Post Note  Patient: Charlotte [name]  Procedure(s) Performed: Procedure(s) (LRB): ANTERIOR CERVICAL DECOMPRESSION/DISCECTOMY FUSION 2 LEVEL/HARDWARE REMOVAL (N/A)  Anesthesia type: General  Patient location: PACU  Post pain: Pain level controlled and Adequate analgesia  Post assessment: Post-op Vital signs reviewed, Patient's Cardiovascular Status Stable, Respiratory Function Stable, Patent Airway and Pain level controlled  Last Vitals:  Filed Vitals:   07/06/13 1031  BP: 155/80  Pulse: 71  Temp: 36.6 C  Resp: 13    Post vital signs: Reviewed and stable  Level of consciousness: awake, alert  and oriented  Complications: No apparent anesthesia complications

## 2013-07-06 NOTE — Brief Op Note (Signed)
07/06/2013  10:26 AM  PATIENT:  Charlotte [name]  52 y.o. female  PRE-OPERATIVE DIAGNOSIS:  Cervical spondylosis with myelopathy C 34, pseudoarthrosis C 56, spondylosis, neck pain, radiculopathy   POST-OPERATIVE DIAGNOSIS:  Cervical spondylosis with myelopathy C 34, pseudoarthrosis C 56, spondylosis, neck pain, radiculopathy   PROCEDURE:  Procedure(s) with comments: ANTERIOR CERVICAL DECOMPRESSION/DISCECTOMY FUSION 2 LEVEL/HARDWARE REMOVAL (N/A) - C3-4 Anterior cervical decompression/diskectomy/fusion/with exploration of fusion and removal of hardware at C 4 through C 6 levels with revision of pseudoarthrosis at C5-6 with allograft/autograft and plating C 3- 4 and C 5 -6 levels  SURGEON:  Surgeon(s) and Role:    * Maeola Harman, MD - Primary    * Reinaldo Meeker, MD - Assisting  PHYSICIAN ASSISTANT:   ASSISTANTS: Poteat, RN   ANESTHESIA:   general  EBL:  Total I/O In: 1650 [I.V.:1650] Out: 75 [Blood:75]  BLOOD ADMINISTERED:none  DRAINS: none   LOCAL MEDICATIONS USED:  LIDOCAINE   SPECIMEN:  No Specimen  DISPOSITION OF SPECIMEN:  N/A  COUNTS:  YES  TOURNIQUET:  * No tourniquets in log *  DICTATION: Patient is 52 year old female with right arm pain and weakness with HNP, spondylosis with myelopathy, radiculopathy with pseudoarthrosis at C 56 and disc herniation with myelopathy at C 34 levels.  Patient has plate at C 4 - C 6 levels.  PROCEDURE: Patient was brought to operating room and following the smooth and uncomplicated induction of general endotracheal anesthesia her head was placed on a horseshoe head holder she was placed in 5 pounds of Holter traction and her anterior neck was prepped and draped in usual sterile fashion. Her previous incision was reopened on the left side of midline after infiltrating the skin and subcutaneous tissues with local lidocaine. The platysmal layer was incised and subplatysmal dissection was performed exposing the anterior border  sternocleidomastoid muscle. Using blunt dissection the carotid sheath was kept lateral and trachea and esophagus kept medial exposing the anterior cervical spine. Careful dissection was performed through dense scar and the previously placed Tether anterior cervical plate was removed.  3 of 5 screws were broken.  The distal screw tips were left in place.  The C 56 level was explored and there was motion and obvious pseudoarthrosis at this level.  The fibrous union was dissected and removed with high speed drill under the operating microscope and the spinal cord dura and C 6 nerve roots were decompressed bilaterally. Distraction pins were placed.  Uncinate spurs and central spondylitic ridges were drilled down with a high-speed drill. The spinal cord dura and both C 6 nerve roots were widely decompressed. Hemostasis was assured. After trial sizing a 6 mm allograft interbody graft was selected and packed with autograft. This was tamped into position and countersunk appropriately. A 16 mm Trestle anterior cervical plate was placed using C arm fluoroscopy to help avoid retained screws.  14 mm  Screws were placed, 2 in C 5 and 2 in C 6. Attention was then paid to the C 34 level, where similar decompression was performed.  Uncinate spurs and central spondylitic ridges were drilled down with a high-speed drill. The spinal cord dura and both C4 nerve roots were widely decompressed. Large spondylotic spurs were removed and the spinal cord thoroughly decompressed. Hemostasis was assured. After trial sizing a 6 mm machined allograft bone wedge was selected and packed with autograft. This was tamped into position and countersunk appropriately. Distraction weight was removed. A 14 mm trestle luxe anterior cervical  plate was affixed to the cervical spine with 14 mm variable-angle screws 2 at C 3, 2 at C 4. All screws were well-positioned and locking mechanisms were engaged. A final X ray was obtained which showed well positioned  grafts and anterior plate without complicating features. Soft tissues were inspected and found to be in good repair. The wound was irrigated. The platysma layer was closed with 3-0 Vicryl stitches and the skin was reapproximated with 3-0 Vicryl subcuticular stitches. The wound was dressed with Dermabond. Counts were correct at the end of the case. Patient was extubated and taken to recovery in stable and satisfactory condition.    PLAN OF CARE: Admit for overnight observation  PATIENT DISPOSITION:  PACU - hemodynamically stable.   Delay start of Pharmacological VTE agent (>24hrs) due to surgical blood loss or risk of bleeding: yes

## 2013-07-06 NOTE — Transfer of Care (Signed)
Immediate Anesthesia Transfer of Care Note  Patient: Charlotte [name]  Procedure(s) Performed: Procedure(s) with comments: ANTERIOR CERVICAL DECOMPRESSION/DISCECTOMY FUSION 2 LEVEL/HARDWARE REMOVAL (N/A) - C3-4 Anterior cervical decompression/diskectomy/fusion/removal of hardware at C6 with revision of Anterior cervical fusion at C5-6  Patient Location: PACU  Anesthesia Type:General  Level of Consciousness: awake and oriented  Airway & Oxygen Therapy: Patient Spontanous Breathing and Patient connected to nasal cannula oxygen  Post-op Assessment: Report given to PACU RN, Post -op Vital signs reviewed and stable and Patient moving all extremities  Post vital signs: Reviewed and stable  Complications: No apparent anesthesia complications

## 2013-07-06 NOTE — Progress Notes (Signed)
Subjective: Patient reports "I feel ok... a little sore"  Objective: Vital signs in last 24 hours: Temp:  [96.8 F (36 C)-97.9 F (36.6 C)] 97.2 F (36.2 C) (11/18 1605) Pulse Rate:  [55-71] 55 (11/18 1605) Resp:  [11-16] 16 (11/18 1605) BP: (135-155)/(77-91) 152/82 mmHg (11/18 1605) SpO2:  [96 %-99 %] 99 % (11/18 1605)  Intake/Output from previous day:   Intake/Output this shift: Total I/O In: 1650 [I.V.:1650] Out: 75 [Blood:75]  Alert, conversant, family at bedside. Good strength BUE and hand intrinsics. Incision with dermabond, mildly puffy, but no swelling, erythema, or drainage. Vista Collar in use. Reports soreness anterior neck and across shoulders as expected.  Lab Results: No results found for this basename: WBC, HGB, HCT, PLT,  in the last 72 hours BMET No results found for this basename: NA, K, CL, CO2, GLUCOSE, BUN, CREATININE, CALCIUM,  in the last 72 hours  Studies/Results: Dg Cervical Spine 1 View  07/06/2013   CLINICAL DATA:  Anterior cervical fusion  EXAM: DG CERVICAL SPINE - 1 VIEW; DG C-ARM 1-60 MIN  COMPARISON:  05/12/2013 and earlier studies  FINDINGS: Single intraoperative fluoroscopic spot image documents changes of interval ACDF C3-4 with graft projecting in the C3-4 interspace. There has been removal of the previously noted hardware and Revision of ACDF hardware C5-6 with graft in the interspace.  IMPRESSION: ACDF C3-4 and revision at C5-6.   Electronically Signed   By: Oley Balm M.D.   On: 07/06/2013 16:14   Dg C-arm 1-60 Min  07/06/2013   CLINICAL DATA:  Anterior cervical fusion  EXAM: DG CERVICAL SPINE - 1 VIEW; DG C-ARM 1-60 MIN  COMPARISON:  05/12/2013 and earlier studies  FINDINGS: Single intraoperative fluoroscopic spot image documents changes of interval ACDF C3-4 with graft projecting in the C3-4 interspace. There has been removal of the previously noted hardware and Revision of ACDF hardware C5-6 with graft in the interspace.  IMPRESSION:  ACDF C3-4 and revision at C5-6.   Electronically Signed   By: Oley Balm M.D.   On: 07/06/2013 16:14    Assessment/Plan: Improving  LOS: 0 days  Continue to mobilize as tolerated; expect d/c to home in am. Rx's from Dr. Venetia Maxon to chart for family to fill tomight: Valium 5mg  & Hydrocodone 5/325.     Georgiann Cocker 07/06/2013, 4:39 PM

## 2013-07-07 MED ORDER — METHOCARBAMOL 500 MG PO TABS: 500.0000 mg | ORAL_TABLET | Freq: Four times a day (QID) | ORAL | Status: DC

## 2013-07-07 MED ORDER — HYDROCODONE-ACETAMINOPHEN 5-325 MG PO TABS: 1.0000 | ORAL_TABLET | ORAL | Status: DC | PRN

## 2013-07-07 NOTE — Progress Notes (Signed)
PT Cancellation Note  Patient Details Name: Charlotte [name] MRN: [id number] DOB: November 24, 1960   Cancelled Treatment:      Pt already discharged from facility.  No PT eval performed.   Merilyn Pagan,Latoi LUBECK 07/07/2013, 9:44 AM

## 2013-07-07 NOTE — Progress Notes (Signed)
Pt given D/C instructions with Rx's, verbal understanding of teaching was given. Pt D/C'd home via wheelchair with husband @ 0930 per MD order. Pt stable @ D/C and had no other needs. Rema Fendt, RN

## 2013-07-07 NOTE — Social Work (Signed)
Referred to this CSW today for ?SNF. Chart reviewed and  Noted plans for d/c home today. CSW to sign off- please contact us if SW needs arise. Reece Levy, MSW, Theresia Majors [phone]

## 2013-07-07 NOTE — Progress Notes (Signed)
Subjective: Patient reports doing well  Objective: Vital signs in last 24 hours: Temp:  [96.8 F (36 C)-98.3 F (36.8 C)] 97.6 F (36.4 C) (11/19 0308) Pulse Rate:  [47-71] 47 (11/19 0308) Resp:  [11-18] 16 (11/19 0308) BP: (134-155)/(77-91) 136/86 mmHg (11/19 0308) SpO2:  [95 %-99 %] 97 % (11/19 0308)  Intake/Output from previous day: 11/18 0701 - 11/19 0700 In: 1650 [I.V.:1650] Out: 75 [Blood:75] Intake/Output this shift:    Physical Exam: Full strength both upper extremities.  No numbness or arm pain.  Dressing CDI  Lab Results: No results found for this basename: WBC, HGB, HCT, PLT,  in the last 72 hours BMET No results found for this basename: NA, K, CL, CO2, GLUCOSE, BUN, CREATININE, CALCIUM,  in the last 72 hours  Studies/Results: Dg Cervical Spine 1 View  07/06/2013   CLINICAL DATA:  Anterior cervical fusion  EXAM: DG CERVICAL SPINE - 1 VIEW; DG C-ARM 1-60 MIN  COMPARISON:  05/12/2013 and earlier studies  FINDINGS: Single intraoperative fluoroscopic spot image documents changes of interval ACDF C3-4 with graft projecting in the C3-4 interspace. There has been removal of the previously noted hardware and Revision of ACDF hardware C5-6 with graft in the interspace.  IMPRESSION: ACDF C3-4 and revision at C5-6.   Electronically Signed   By: Oley Balm M.D.   On: 07/06/2013 16:14   Dg C-arm 1-60 Min  07/06/2013   CLINICAL DATA:  Anterior cervical fusion  EXAM: DG CERVICAL SPINE - 1 VIEW; DG C-ARM 1-60 MIN  COMPARISON:  05/12/2013 and earlier studies  FINDINGS: Single intraoperative fluoroscopic spot image documents changes of interval ACDF C3-4 with graft projecting in the C3-4 interspace. There has been removal of the previously noted hardware and Revision of ACDF hardware C5-6 with graft in the interspace.  IMPRESSION: ACDF C3-4 and revision at C5-6.   Electronically Signed   By: Oley Balm M.D.   On: 07/06/2013 16:14    Assessment/Plan: Doing well.  D/C  home    LOS: 1 day    Dorian Heckle, MD 07/07/2013, 8:24 AM

## 2013-07-07 NOTE — Discharge Summary (Signed)
Physician Discharge Summary  Patient ID: Charlotte [name] MRN: [id number] DOB/AGE: 1961/04/18 52 y.o.  Admit date: 07/06/2013 Discharge date: 07/07/2013  Admission Diagnoses:Cervical pseudoarthrosis C 56 with Cervical stenosis and myelopathy C 34 with HNP and spondylosis  Discharge Diagnoses: Cervical pseudoarthrosis C 56 with Cervical stenosis and myelopathy C 34 with HNP and spondylosis  Active Problems:   * No active hospital problems. *   Discharged Condition: good  Hospital Course: Uncomplicated Exploration of fusion with revision anterior cervical decompression and fusion C 56 with anterior cervical decompression and fusion C 34   Consults: None  Significant Diagnostic Studies: None  Treatments: surgery: Exploration of fusion with revision anterior cervical decompression and fusion C 56 with anterior cervical decompression and fusion C 34    Discharge Exam: Blood pressure 136/86, pulse 47, temperature 97.6 F (36.4 C), temperature source Oral, resp. rate 16, SpO2 97.00%. Neurologic: Alert and oriented X 3, normal strength and tone. Normal symmetric reflexes. Normal coordination and gait Wound: CDI  Disposition: Home   Future Appointments Provider Department Dept Phone   08/02/2013 3:15 PM [facility] [place] [facility] [phone]   Patient should wear two piece clothing and wear no powder or deodorant. Patient should arrive 15 minutes early.       Medication List         ALPRAZolam 0.25 MG tablet  Commonly known as:  XANAX  Take 0.25-0.5 mg by mouth at bedtime as needed for anxiety or sleep.     citalopram 20 MG tablet  Commonly known as:  CELEXA  Take 20 mg by mouth daily.     estradiol 0.1 MG/24HR patch  Commonly known as:  VIVELLE-DOT  Place 1 patch onto the skin 2 (two) times a week.     HYDROcodone-acetaminophen 5-325 MG per tablet  Commonly known as:  NORCO/VICODIN  Take 1-2 tablets by mouth every 4 (four) hours as needed  for moderate pain or severe pain.     JUICE PLUS FIBRE PO  Take 1 capsule by mouth daily.     methocarbamol 500 MG tablet  Commonly known as:  ROBAXIN  Take 1 tablet (500 mg total) by mouth 4 (four) times daily.     zolpidem 12.5 MG CR tablet  Commonly known as:  AMBIEN CR  Take 12.5 mg by mouth at bedtime as needed for sleep.         Signed: Dorian Heckle, MD 07/07/2013, 8:28 AM

## 2013-07-07 NOTE — Progress Notes (Signed)
OT Cancellation Note  Patient Details Name: Charlotte [name] MRN: [id number] DOB: Oct 25, 1960   Cancelled Treatment:    Reason Eval/Treat Not Completed: Other (comment) (discharged prior to OT arrival. Rn Ashely aware) Pt with no acute OT needs  Harolyn Rutherford Pager: [phone]  07/07/2013, 9:24 AM

## 2013-07-09 ENCOUNTER — Encounter (HOSPITAL_COMMUNITY): Payer: Self-pay | Admitting: Neurosurgery

## 2013-08-02 ENCOUNTER — Other Ambulatory Visit (HOSPITAL_COMMUNITY): Payer: Self-pay | Admitting: Obstetrics & Gynecology

## 2013-08-02 ENCOUNTER — Ambulatory Visit (HOSPITAL_COMMUNITY)
Admission: RE | Admit: 2013-08-02 | Discharge: 2013-08-02 | Disposition: A | Discharge status: Home / Self Care | Payer: 59 | Source: Ambulatory Visit | Attending: Obstetrics & Gynecology | Admitting: Obstetrics & Gynecology

## 2013-08-02 DIAGNOSIS — Z1231 Encounter for screening mammogram for malignant neoplasm of breast: Secondary | ICD-10-CM

## 2013-08-10 ENCOUNTER — Other Ambulatory Visit: Payer: Self-pay | Admitting: Obstetrics & Gynecology

## 2013-08-10 DIAGNOSIS — R928 Other abnormal and inconclusive findings on diagnostic imaging of breast: Secondary | ICD-10-CM

## 2013-10-20 ENCOUNTER — Ambulatory Visit: Payer: 59 | Attending: Neurosurgery

## 2013-10-20 DIAGNOSIS — M542 Cervicalgia: Secondary | ICD-10-CM | POA: Insufficient documentation

## 2013-10-20 DIAGNOSIS — IMO0001 Reserved for inherently not codable concepts without codable children: Secondary | ICD-10-CM | POA: Insufficient documentation

## 2013-10-20 DIAGNOSIS — R5381 Other malaise: Secondary | ICD-10-CM | POA: Insufficient documentation

## 2013-10-22 ENCOUNTER — Ambulatory Visit: Payer: 59 | Admitting: Physical Therapy

## 2013-10-28 ENCOUNTER — Ambulatory Visit: Payer: 59 | Admitting: Physical Therapy

## 2013-11-02 ENCOUNTER — Ambulatory Visit: Payer: 59

## 2013-11-05 ENCOUNTER — Ambulatory Visit: Payer: 59 | Admitting: Physical Therapy

## 2013-11-09 ENCOUNTER — Ambulatory Visit: Payer: 59

## 2013-11-12 ENCOUNTER — Ambulatory Visit: Payer: 59 | Admitting: Physical Therapy

## 2013-11-16 ENCOUNTER — Ambulatory Visit: Payer: 59

## 2013-11-26 ENCOUNTER — Ambulatory Visit: Payer: 59 | Attending: Neurosurgery | Admitting: Physical Therapy

## 2013-11-26 DIAGNOSIS — R5381 Other malaise: Secondary | ICD-10-CM | POA: Insufficient documentation

## 2013-11-26 DIAGNOSIS — IMO0001 Reserved for inherently not codable concepts without codable children: Secondary | ICD-10-CM | POA: Insufficient documentation

## 2013-11-26 DIAGNOSIS — M542 Cervicalgia: Secondary | ICD-10-CM | POA: Insufficient documentation

## 2013-12-01 ENCOUNTER — Ambulatory Visit: Payer: 59

## 2013-12-08 ENCOUNTER — Ambulatory Visit: Payer: 59

## 2014-08-29 ENCOUNTER — Other Ambulatory Visit: Payer: Self-pay | Admitting: Obstetrics & Gynecology

## 2014-08-30 LAB — CYTOLOGY - PAP

## 2015-08-31 MED FILL — PROGESTERONE 200 MG CAPSULE: 200 | 90 days supply | Qty: 24 | Fill #0

## 2015-08-31 MED FILL — CITALOPRAM HBR 10 MG TABLET: 10 | 90 days supply | Qty: 90 | Fill #0

## 2015-09-04 MED FILL — METHOCARBAMOL 500 MG TABLET: 500 | 30 days supply | Qty: 90 | Fill #0

## 2015-09-05 MED FILL — MINIVELLE 0.1 MG PATCH: 0.1 | 28 days supply | Qty: 8 | Fill #0

## 2015-10-04 ENCOUNTER — Other Ambulatory Visit: Payer: Self-pay | Admitting: Obstetrics & Gynecology

## 2015-10-04 DIAGNOSIS — Z01419 Encounter for gynecological examination (general) (routine) without abnormal findings: Secondary | ICD-10-CM | POA: Diagnosis not present

## 2015-10-04 DIAGNOSIS — N6489 Other specified disorders of breast: Secondary | ICD-10-CM

## 2015-10-04 DIAGNOSIS — Z6829 Body mass index (BMI) 29.0-29.9, adult: Secondary | ICD-10-CM | POA: Diagnosis not present

## 2015-10-06 MED FILL — MINIVELLE 0.1 MG PATCH: 0.1 | 28 days supply | Qty: 8 | Fill #1

## 2015-10-12 ENCOUNTER — Ambulatory Visit
Admission: RE | Admit: 2015-10-12 | Discharge: 2015-10-12 | Disposition: A | Discharge status: Home / Self Care | Payer: 59 | Source: Ambulatory Visit | Attending: Obstetrics & Gynecology | Admitting: Obstetrics & Gynecology

## 2015-10-12 DIAGNOSIS — N6489 Other specified disorders of breast: Secondary | ICD-10-CM

## 2015-10-12 DIAGNOSIS — R928 Other abnormal and inconclusive findings on diagnostic imaging of breast: Secondary | ICD-10-CM | POA: Diagnosis not present

## 2015-11-01 MED FILL — ZOLPIDEM TART ER 12.5 MG TA: 12.5 | 30 days supply | Qty: 30 | Fill #0

## 2015-11-01 MED FILL — ESTRADIOL 0.1 MG PATCH: 0.1 | 28 days supply | Qty: 8 | Fill #0

## 2015-11-01 MED FILL — ALPRAZolam 0.5 MG TABS: 0.5 | 30 days supply | Qty: 90 | Fill #0

## 2015-11-14 DIAGNOSIS — K635 Polyp of colon: Secondary | ICD-10-CM | POA: Diagnosis not present

## 2015-11-14 DIAGNOSIS — E7800 Pure hypercholesterolemia, unspecified: Secondary | ICD-10-CM | POA: Diagnosis not present

## 2015-11-14 DIAGNOSIS — I10 Essential (primary) hypertension: Secondary | ICD-10-CM | POA: Diagnosis not present

## 2015-11-14 DIAGNOSIS — F411 Generalized anxiety disorder: Secondary | ICD-10-CM | POA: Diagnosis not present

## 2015-11-14 DIAGNOSIS — Z0000 Encounter for general adult medical examination without abnormal findings: Secondary | ICD-10-CM | POA: Diagnosis not present

## 2015-11-14 DIAGNOSIS — R5383 Other fatigue: Secondary | ICD-10-CM | POA: Diagnosis not present

## 2015-11-14 MED FILL — CITALOPRAM HBR 20 MG TABLET: 20 | 90 days supply | Qty: 90 | Fill #0

## 2015-12-04 MED FILL — ESTRADIOL 0.1 MG PATCH: 0.1 | 28 days supply | Qty: 8 | Fill #1

## 2016-01-01 MED FILL — ESTRADIOL 0.1 MG PATCH: 0.1 | 28 days supply | Qty: 8 | Fill #2

## 2016-01-01 MED FILL — PROGESTERONE 200 MG CAPSULE: 200 | 84 days supply | Qty: 24 | Fill #0

## 2016-01-25 MED FILL — ESTRADIOL 0.1 MG PATCH: 0.1 | 28 days supply | Qty: 8 | Fill #3

## 2016-02-14 MED FILL — CITALOPRAM HBR 20 MG TABLET: 20 | 90 days supply | Qty: 90 | Fill #1

## 2016-02-15 DIAGNOSIS — E7800 Pure hypercholesterolemia, unspecified: Secondary | ICD-10-CM | POA: Diagnosis not present

## 2016-02-21 MED FILL — ESTRADIOL 0.1 MG PATCH: 0.1 | 84 days supply | Qty: 24 | Fill #4

## 2016-05-08 DIAGNOSIS — H35033 Hypertensive retinopathy, bilateral: Secondary | ICD-10-CM | POA: Diagnosis not present

## 2016-05-08 DIAGNOSIS — H524 Presbyopia: Secondary | ICD-10-CM | POA: Diagnosis not present

## 2016-05-08 DIAGNOSIS — H5203 Hypermetropia, bilateral: Secondary | ICD-10-CM | POA: Diagnosis not present

## 2016-05-08 DIAGNOSIS — H04123 Dry eye syndrome of bilateral lacrimal glands: Secondary | ICD-10-CM | POA: Diagnosis not present

## 2016-05-08 DIAGNOSIS — I10 Essential (primary) hypertension: Secondary | ICD-10-CM | POA: Diagnosis not present

## 2016-05-08 DIAGNOSIS — H52223 Regular astigmatism, bilateral: Secondary | ICD-10-CM | POA: Diagnosis not present

## 2016-05-08 DIAGNOSIS — H25013 Cortical age-related cataract, bilateral: Secondary | ICD-10-CM | POA: Diagnosis not present

## 2016-05-16 MED FILL — ZOLPIDEM TART ER 12.5 MG TA: 12.5 | 30 days supply | Qty: 30 | Fill #0

## 2016-05-16 MED FILL — ESTRADIOL 0.1 MG PATCH: 0.1 | 84 days supply | Qty: 24 | Fill #5

## 2016-05-16 MED FILL — CITALOPRAM HBR 20 MG TABLET: 20 | 90 days supply | Qty: 90 | Fill #2

## 2016-05-16 MED FILL — PROGESTERONE 200 MG CAPSULE: 200 | 84 days supply | Qty: 24 | Fill #1

## 2016-08-05 MED FILL — ESTRADIOL 0.1 MG PATCH: 0.1 | 28 days supply | Qty: 8 | Fill #0

## 2016-08-05 MED FILL — PROGESTERONE 200 MG CAPSULE: 200 | 84 days supply | Qty: 24 | Fill #2

## 2016-08-05 MED FILL — METHOCARBAMOL 500 MG TABLET: 500 | 30 days supply | Qty: 90 | Fill #0

## 2016-08-05 MED FILL — CITALOPRAM HBR 20 MG TABLET: 20 | 90 days supply | Qty: 90 | Fill #3

## 2016-08-05 MED FILL — ZOLPIDEM TART ER 12.5 MG TA: 12.5 | 30 days supply | Qty: 30 | Fill #1

## 2016-08-05 MED FILL — ALPRAZolam 0.5 MG TABS: 0.5 | 30 days supply | Qty: 90 | Fill #0

## 2016-09-16 DIAGNOSIS — L821 Other seborrheic keratosis: Secondary | ICD-10-CM | POA: Diagnosis not present

## 2016-09-16 DIAGNOSIS — D1801 Hemangioma of skin and subcutaneous tissue: Secondary | ICD-10-CM | POA: Diagnosis not present

## 2016-09-16 DIAGNOSIS — L573 Poikiloderma of Civatte: Secondary | ICD-10-CM | POA: Diagnosis not present

## 2016-09-16 DIAGNOSIS — D2261 Melanocytic nevi of right upper limb, including shoulder: Secondary | ICD-10-CM | POA: Diagnosis not present

## 2016-09-16 DIAGNOSIS — L738 Other specified follicular disorders: Secondary | ICD-10-CM | POA: Diagnosis not present

## 2016-09-16 DIAGNOSIS — D225 Melanocytic nevi of trunk: Secondary | ICD-10-CM | POA: Diagnosis not present

## 2016-09-19 MED FILL — ESTRADIOL 0.1 MG PATCH: 0.1 | 28 days supply | Qty: 8 | Fill #1

## 2016-10-22 MED FILL — ESTRADIOL 0.1 MG PATCH: 0.1 | 28 days supply | Qty: 8 | Fill #2

## 2016-11-05 MED FILL — CITALOPRAM HBR 20 MG TABLET: 20 | 90 days supply | Qty: 90 | Fill #0

## 2016-11-25 MED FILL — ESTRADIOL 0.1 MG PATCH: 0.1 | 28 days supply | Qty: 8 | Fill #0

## 2016-12-05 DIAGNOSIS — Z7689 Persons encountering health services in other specified circumstances: Secondary | ICD-10-CM | POA: Diagnosis not present

## 2016-12-05 DIAGNOSIS — F411 Generalized anxiety disorder: Secondary | ICD-10-CM | POA: Diagnosis not present

## 2016-12-05 DIAGNOSIS — E7800 Pure hypercholesterolemia, unspecified: Secondary | ICD-10-CM | POA: Diagnosis not present

## 2016-12-05 DIAGNOSIS — I10 Essential (primary) hypertension: Secondary | ICD-10-CM | POA: Diagnosis not present

## 2016-12-05 DIAGNOSIS — M25675 Stiffness of left foot, not elsewhere classified: Secondary | ICD-10-CM | POA: Diagnosis not present

## 2016-12-05 DIAGNOSIS — M25541 Pain in joints of right hand: Secondary | ICD-10-CM | POA: Diagnosis not present

## 2016-12-05 DIAGNOSIS — M25674 Stiffness of right foot, not elsewhere classified: Secondary | ICD-10-CM | POA: Diagnosis not present

## 2016-12-17 ENCOUNTER — Other Ambulatory Visit: Payer: Self-pay | Admitting: Family Medicine

## 2016-12-17 ENCOUNTER — Ambulatory Visit
Admission: RE | Admit: 2016-12-17 | Discharge: 2016-12-17 | Disposition: A | Discharge status: Home / Self Care | Payer: 59 | Source: Ambulatory Visit | Attending: Family Medicine | Admitting: Family Medicine

## 2016-12-17 DIAGNOSIS — M25541 Pain in joints of right hand: Secondary | ICD-10-CM

## 2016-12-17 DIAGNOSIS — M79641 Pain in right hand: Secondary | ICD-10-CM | POA: Diagnosis not present

## 2016-12-26 MED FILL — PROGESTERONE 200 MG CAPSULE: 200 | 84 days supply | Qty: 24 | Fill #0

## 2016-12-26 MED FILL — ESTRADIOL 0.1 MG PATCH: 0.1 | 28 days supply | Qty: 8 | Fill #0

## 2017-01-23 MED FILL — ESTRADIOL 0.1 MG PATCH: 0.1 | 28 days supply | Qty: 8 | Fill #1

## 2017-01-30 DIAGNOSIS — Z01419 Encounter for gynecological examination (general) (routine) without abnormal findings: Secondary | ICD-10-CM | POA: Diagnosis not present

## 2017-01-30 DIAGNOSIS — Z6827 Body mass index (BMI) 27.0-27.9, adult: Secondary | ICD-10-CM | POA: Diagnosis not present

## 2017-02-07 MED FILL — CITALOPRAM HBR 20 MG TABLET: 20 | 90 days supply | Qty: 90 | Fill #1

## 2017-02-25 MED FILL — ZOLPIDEM TART ER 12.5 MG TA: 12.5 | 30 days supply | Qty: 30 | Fill #0

## 2017-02-25 MED FILL — ESTRADIOL 0.1 MG PATCH: 0.1 | 84 days supply | Qty: 24 | Fill #0

## 2017-02-25 MED FILL — ALPRAZolam 0.5 MG TABS: 0.5 | 30 days supply | Qty: 90 | Fill #0

## 2017-05-09 MED FILL — PROGESTERONE 200 MG CAPSULE: 200 | 84 days supply | Qty: 24 | Fill #0

## 2017-05-09 MED FILL — CITALOPRAM HBR 20 MG TABLET: 20 | 90 days supply | Qty: 90 | Fill #2

## 2017-05-20 MED FILL — ESTRADIOL 0.1 MG PATCH: 0.1 | 84 days supply | Qty: 24 | Fill #1

## 2017-08-15 MED FILL — CITALOPRAM HBR 20 MG TABLET: 20 | 90 days supply | Qty: 90 | Fill #0

## 2017-09-01 MED FILL — ESTRADIOL 0.1 MG PATCH: 0.1 | 84 days supply | Qty: 24 | Fill #2

## 2017-09-01 MED FILL — PROGESTERONE 200 MG CAPSULE: 200 | 84 days supply | Qty: 24 | Fill #1

## 2017-11-12 MED FILL — ALPRAZolam 0.5 MG TABS: 0.5 | 30 days supply | Qty: 90 | Fill #0

## 2017-11-12 MED FILL — CITALOPRAM HBR 20 MG TABLET: 20 | 90 days supply | Qty: 90 | Fill #1

## 2017-12-04 MED FILL — ESTRADIOL 0.1 MG PATCH: 0.1 | 84 days supply | Qty: 24 | Fill #3

## 2018-01-30 MED FILL — PROGESTERONE MICRONIZED 200: 200 | 84 days supply | Qty: 24 | Fill #2

## 2018-02-20 MED FILL — CITALOPRAM HBR 20 MG TABLET: 20 | 90 days supply | Qty: 90 | Fill #0

## 2018-02-25 DIAGNOSIS — Z01419 Encounter for gynecological examination (general) (routine) without abnormal findings: Secondary | ICD-10-CM | POA: Diagnosis not present

## 2018-02-25 DIAGNOSIS — Z6828 Body mass index (BMI) 28.0-28.9, adult: Secondary | ICD-10-CM | POA: Diagnosis not present

## 2018-04-23 MED FILL — ALPRAZolam 0.5 MG TABS: 0.5 | 30 days supply | Qty: 90 | Fill #0

## 2018-04-23 MED FILL — ZOLPIDEM TART ER 12.5 MG TA: 12.5 | 60 days supply | Qty: 60 | Fill #0

## 2018-04-23 MED FILL — ESTRADIOL 0.1 MG/24HR PTTW: 0.1 | 84 days supply | Qty: 24 | Fill #0

## 2018-04-23 MED FILL — PROGESTERONE 200 MG CAPSULE: 200 | 84 days supply | Qty: 36 | Fill #0

## 2018-05-07 MED FILL — CITALOPRAM HBR 20 MG TABLET: 20 | 90 days supply | Qty: 90 | Fill #0

## 2018-05-25 MED FILL — CITALOPRAM HBR 20 MG TABLET: 20 | 90 days supply | Qty: 90 | Fill #0

## 2018-05-28 DIAGNOSIS — K5909 Other constipation: Secondary | ICD-10-CM | POA: Diagnosis not present

## 2018-05-28 DIAGNOSIS — R1084 Generalized abdominal pain: Secondary | ICD-10-CM | POA: Diagnosis not present

## 2018-05-28 DIAGNOSIS — K219 Gastro-esophageal reflux disease without esophagitis: Secondary | ICD-10-CM | POA: Diagnosis not present

## 2018-05-29 ENCOUNTER — Other Ambulatory Visit (HOSPITAL_COMMUNITY): Payer: Self-pay | Admitting: Family Medicine

## 2018-05-29 DIAGNOSIS — R1084 Generalized abdominal pain: Secondary | ICD-10-CM

## 2018-06-01 ENCOUNTER — Ambulatory Visit (HOSPITAL_COMMUNITY)
Admission: RE | Admit: 2018-06-01 | Discharge: 2018-06-01 | Disposition: A | Discharge status: Home / Self Care | Payer: 59 | Source: Ambulatory Visit | Attending: Family Medicine | Admitting: Family Medicine

## 2018-06-01 DIAGNOSIS — R1084 Generalized abdominal pain: Secondary | ICD-10-CM | POA: Diagnosis not present

## 2018-06-01 DIAGNOSIS — K7689 Other specified diseases of liver: Secondary | ICD-10-CM | POA: Diagnosis not present

## 2018-06-10 DIAGNOSIS — R143 Flatulence: Secondary | ICD-10-CM | POA: Diagnosis not present

## 2018-06-10 DIAGNOSIS — K219 Gastro-esophageal reflux disease without esophagitis: Secondary | ICD-10-CM | POA: Diagnosis not present

## 2018-06-10 DIAGNOSIS — R140 Abdominal distension (gaseous): Secondary | ICD-10-CM | POA: Diagnosis not present

## 2018-06-10 DIAGNOSIS — K5909 Other constipation: Secondary | ICD-10-CM | POA: Diagnosis not present

## 2018-06-10 DIAGNOSIS — R1084 Generalized abdominal pain: Secondary | ICD-10-CM | POA: Diagnosis not present

## 2018-06-10 MED FILL — PANTOPRAZOLE SOD DR 40 MG T: 40 | 90 days supply | Qty: 180 | Fill #0

## 2018-06-25 DIAGNOSIS — E7800 Pure hypercholesterolemia, unspecified: Secondary | ICD-10-CM | POA: Diagnosis not present

## 2018-06-25 DIAGNOSIS — R109 Unspecified abdominal pain: Secondary | ICD-10-CM | POA: Diagnosis not present

## 2018-06-25 DIAGNOSIS — F411 Generalized anxiety disorder: Secondary | ICD-10-CM | POA: Diagnosis not present

## 2018-08-05 MED FILL — CITALOPRAM HBR 20 MG TABLET: 20 | 90 days supply | Qty: 90 | Fill #0

## 2018-08-26 MED FILL — DOTTI 0.1 MG/24HR PTTW: 0.1 | 28 days supply | Qty: 8 | Fill #0

## 2018-10-12 MED FILL — DOTTI 0.1 MG/24HR PTTW: 0.1 | 28 days supply | Qty: 8 | Fill #1

## 2018-11-02 MED FILL — CITALOPRAM HBR 20 MG TABLET: 20 | 90 days supply | Qty: 90 | Fill #1

## 2018-11-02 MED FILL — PROGESTERONE 200 MG CAPSULE: 200 | 84 days supply | Qty: 36 | Fill #1

## 2018-11-23 MED FILL — DOTTI 0.1 MG/24HR PTTW: 0.1 | 28 days supply | Qty: 8 | Fill #2

## 2018-11-24 MED FILL — ALPRAZolam 0.5 MG TABS: 0.5 | 30 days supply | Qty: 60 | Fill #0

## 2018-12-17 MED FILL — DOTTI 0.1 MG/24HR PTTW: 0.1 | 28 days supply | Qty: 8 | Fill #0

## 2018-12-21 DIAGNOSIS — L738 Other specified follicular disorders: Secondary | ICD-10-CM | POA: Diagnosis not present

## 2018-12-21 DIAGNOSIS — D1801 Hemangioma of skin and subcutaneous tissue: Secondary | ICD-10-CM | POA: Diagnosis not present

## 2018-12-21 DIAGNOSIS — L821 Other seborrheic keratosis: Secondary | ICD-10-CM | POA: Diagnosis not present

## 2018-12-21 DIAGNOSIS — D225 Melanocytic nevi of trunk: Secondary | ICD-10-CM | POA: Diagnosis not present

## 2018-12-21 DIAGNOSIS — K130 Diseases of lips: Secondary | ICD-10-CM | POA: Diagnosis not present

## 2018-12-21 DIAGNOSIS — D485 Neoplasm of uncertain behavior of skin: Secondary | ICD-10-CM | POA: Diagnosis not present

## 2018-12-21 DIAGNOSIS — D2272 Melanocytic nevi of left lower limb, including hip: Secondary | ICD-10-CM | POA: Diagnosis not present

## 2018-12-21 DIAGNOSIS — D2339 Other benign neoplasm of skin of other parts of face: Secondary | ICD-10-CM | POA: Diagnosis not present

## 2019-01-08 MED FILL — DOTTI 0.1 MG/24HR PTTW: 0.1 | 28 days supply | Qty: 8 | Fill #1

## 2019-02-01 MED FILL — CITALOPRAM HBR 20 MG TABLET: 20 | 90 days supply | Qty: 90 | Fill #2

## 2019-02-05 MED FILL — DOTTI 0.1 MG/24HR PTTW: 0.1 | 28 days supply | Qty: 8 | Fill #2

## 2019-04-19 MED FILL — DOTTI 0.1 MG/24HR PTTW: 0.1 | 28 days supply | Qty: 8 | Fill #0

## 2019-05-21 MED FILL — DOTTI 0.1 MG/24HR PTTW: 0.1 | 28 days supply | Qty: 8 | Fill #0

## 2019-05-21 MED FILL — ALPRAZolam 0.5 MG TABS: 0.5 | 30 days supply | Qty: 60 | Fill #1

## 2019-05-21 MED FILL — CITALOPRAM HBR 20 MG TABLET: 20 | 90 days supply | Qty: 90 | Fill #3

## 2019-07-22 MED FILL — PROGESTERONE 200 MG CAPSULE: 200 | 12 days supply | Qty: 12 | Fill #0

## 2019-07-22 MED FILL — DOTTI 0.1 MG/24HR PTTW: 0.1 | 28 days supply | Qty: 8 | Fill #0

## 2019-08-25 DIAGNOSIS — Z6830 Body mass index (BMI) 30.0-30.9, adult: Secondary | ICD-10-CM | POA: Diagnosis not present

## 2019-08-25 DIAGNOSIS — Z01419 Encounter for gynecological examination (general) (routine) without abnormal findings: Secondary | ICD-10-CM | POA: Diagnosis not present

## 2019-08-30 MED FILL — CITALOPRAM HBR 20 MG TABLET: 20 | 90 days supply | Qty: 90 | Fill #0

## 2019-08-30 MED FILL — DOTTI 0.1 MG/24HR PTTW: 0.1 | 84 days supply | Qty: 24 | Fill #0

## 2019-10-05 MED FILL — PROGESTERONE MICRONIZED 200: 200 | 30 days supply | Qty: 30 | Fill #0

## 2019-12-02 ENCOUNTER — Other Ambulatory Visit (HOSPITAL_COMMUNITY): Payer: Self-pay | Admitting: Family Medicine

## 2019-12-02 DIAGNOSIS — F411 Generalized anxiety disorder: Secondary | ICD-10-CM | POA: Diagnosis not present

## 2019-12-02 DIAGNOSIS — E7800 Pure hypercholesterolemia, unspecified: Secondary | ICD-10-CM | POA: Diagnosis not present

## 2019-12-02 MED FILL — CITALOPRAM HBR 20 MG TABLET: 20 | 90 days supply | Qty: 90 | Fill #0

## 2019-12-09 MED FILL — DOTTI 0.1 MG/24HR PTTW: 0.1 | 84 days supply | Qty: 24 | Fill #1

## 2020-01-18 MED FILL — ALPRAZolam 0.5 MG TABS: 0.5 | 30 days supply | Qty: 90 | Fill #0

## 2020-03-01 MED FILL — PROGESTERONE 200 MG CAPS: 200 | 30 days supply | Qty: 30 | Fill #1

## 2020-03-01 MED FILL — CITALOPRAM HBR 20 MG TABLET: 20 | 90 days supply | Qty: 90 | Fill #1

## 2020-03-01 MED FILL — DOTTI 0.1 MG/24HR PTTW: 0.1 | 84 days supply | Qty: 24 | Fill #2

## 2020-03-14 DIAGNOSIS — L738 Other specified follicular disorders: Secondary | ICD-10-CM | POA: Diagnosis not present

## 2020-03-14 DIAGNOSIS — D1801 Hemangioma of skin and subcutaneous tissue: Secondary | ICD-10-CM | POA: Diagnosis not present

## 2020-03-14 DIAGNOSIS — D225 Melanocytic nevi of trunk: Secondary | ICD-10-CM | POA: Diagnosis not present

## 2020-03-14 DIAGNOSIS — D485 Neoplasm of uncertain behavior of skin: Secondary | ICD-10-CM | POA: Diagnosis not present

## 2020-05-09 MED FILL — ALPRAZolam 0.5 MG TABS: 0.5 | 30 days supply | Qty: 90 | Fill #0

## 2020-06-01 DIAGNOSIS — Z20822 Contact with and (suspected) exposure to covid-19: Secondary | ICD-10-CM | POA: Diagnosis not present

## 2020-06-11 DIAGNOSIS — Z20822 Contact with and (suspected) exposure to covid-19: Secondary | ICD-10-CM | POA: Diagnosis not present

## 2020-07-06 MED FILL — CITALOPRAM HBR 20 MG TABLET: 20 | 90 days supply | Qty: 90 | Fill #2

## 2020-07-06 MED FILL — DOTTI 0.1 MG/24HR PTTW: 0.1 | 84 days supply | Qty: 24 | Fill #3

## 2020-07-24 DIAGNOSIS — Z20822 Contact with and (suspected) exposure to covid-19: Secondary | ICD-10-CM | POA: Diagnosis not present

## 2020-07-31 DIAGNOSIS — Z20822 Contact with and (suspected) exposure to covid-19: Secondary | ICD-10-CM | POA: Diagnosis not present

## 2020-08-08 DIAGNOSIS — Z20822 Contact with and (suspected) exposure to covid-19: Secondary | ICD-10-CM | POA: Diagnosis not present

## 2020-09-20 ENCOUNTER — Other Ambulatory Visit (HOSPITAL_COMMUNITY): Payer: Self-pay | Admitting: Obstetrics & Gynecology

## 2020-09-20 MED FILL — PROGESTERONE 200 MG CAPS: 200 | 30 days supply | Qty: 30 | Fill #0

## 2020-09-25 ENCOUNTER — Other Ambulatory Visit (HOSPITAL_COMMUNITY): Payer: Self-pay | Admitting: Family Medicine

## 2020-09-25 DIAGNOSIS — R519 Headache, unspecified: Secondary | ICD-10-CM | POA: Diagnosis not present

## 2020-09-25 DIAGNOSIS — S161XXA Strain of muscle, fascia and tendon at neck level, initial encounter: Secondary | ICD-10-CM | POA: Diagnosis not present

## 2020-09-25 DIAGNOSIS — F411 Generalized anxiety disorder: Secondary | ICD-10-CM | POA: Diagnosis not present

## 2020-09-25 DIAGNOSIS — E7800 Pure hypercholesterolemia, unspecified: Secondary | ICD-10-CM | POA: Diagnosis not present

## 2020-09-25 DIAGNOSIS — I10 Essential (primary) hypertension: Secondary | ICD-10-CM | POA: Diagnosis not present

## 2020-09-25 DIAGNOSIS — R945 Abnormal results of liver function studies: Secondary | ICD-10-CM | POA: Diagnosis not present

## 2020-09-25 MED FILL — CITALOPRAM HBR 20 MG TABLET: 20 | 90 days supply | Qty: 90 | Fill #0

## 2020-09-25 MED FILL — LOSARTAN POTASSIUM 50 MG TA: 50 | 30 days supply | Qty: 60 | Fill #0

## 2020-09-25 MED FILL — METHOCARBAMOL 500 MG TABS: 500 | 5 days supply | Qty: 30 | Fill #0

## 2020-10-09 ENCOUNTER — Other Ambulatory Visit (HOSPITAL_COMMUNITY): Payer: Self-pay | Admitting: Obstetrics & Gynecology

## 2020-10-09 MED FILL — ALPRAZolam 0.5 MG TABS: 0.5 | 30 days supply | Qty: 90 | Fill #0

## 2020-10-25 ENCOUNTER — Other Ambulatory Visit (HOSPITAL_COMMUNITY): Payer: Self-pay | Admitting: Obstetrics & Gynecology

## 2020-10-25 MED FILL — DOTTI 0.1 MG/24HR PTTW: 0.1 | 84 days supply | Qty: 24 | Fill #0

## 2020-10-25 MED FILL — LOSARTAN POTASSIUM 50 MG TA: 50 | 30 days supply | Qty: 60 | Fill #1

## 2020-11-01 DIAGNOSIS — Z01419 Encounter for gynecological examination (general) (routine) without abnormal findings: Secondary | ICD-10-CM | POA: Diagnosis not present

## 2020-11-01 DIAGNOSIS — Z6832 Body mass index (BMI) 32.0-32.9, adult: Secondary | ICD-10-CM | POA: Diagnosis not present

## 2020-11-08 DIAGNOSIS — R945 Abnormal results of liver function studies: Secondary | ICD-10-CM | POA: Diagnosis not present

## 2020-11-09 ENCOUNTER — Other Ambulatory Visit (HOSPITAL_BASED_OUTPATIENT_CLINIC_OR_DEPARTMENT_OTHER): Payer: Self-pay

## 2020-11-27 ENCOUNTER — Other Ambulatory Visit (HOSPITAL_COMMUNITY): Payer: Self-pay

## 2020-11-27 MED FILL — Losartan Potassium Tab 50 MG: ORAL | 90 days supply | Qty: 180 | Fill #0 | Status: AC

## 2020-11-30 ENCOUNTER — Other Ambulatory Visit (HOSPITAL_COMMUNITY): Payer: Self-pay

## 2021-01-12 ENCOUNTER — Other Ambulatory Visit (HOSPITAL_COMMUNITY): Payer: Self-pay

## 2021-01-12 MED ORDER — ESTRADIOL 0.1 MG/24HR TD PTTW
MEDICATED_PATCH | TRANSDERMAL | 4 refills | Status: DC
  Filled 2021-01-12: qty 24, 84d supply, fill #0
  Filled 2021-05-29: qty 24, 84d supply, fill #1
  Filled 2021-09-17: qty 24, 84d supply, fill #2

## 2021-01-12 MED ORDER — ALPRAZOLAM 0.5 MG PO TABS
0.5000 mg | ORAL_TABLET | Freq: Three times a day (TID) | ORAL | 5 refills | Status: DC | PRN
  Filled 2021-01-12: qty 90, 30d supply, fill #0
  Filled 2021-03-05: qty 90, 30d supply, fill #1

## 2021-01-12 MED ORDER — PROGESTERONE 200 MG PO CAPS
200.0000 mg | ORAL_CAPSULE | Freq: Every evening | ORAL | 4 refills | Status: DC
  Filled 2021-01-12: qty 36, 90d supply, fill #0
  Filled 2021-07-27: qty 36, 90d supply, fill #1

## 2021-01-12 MED FILL — Citalopram Hydrobromide Tab 20 MG (Base Equiv): ORAL | 90 days supply | Qty: 90 | Fill #0 | Status: AC

## 2021-01-13 ENCOUNTER — Other Ambulatory Visit (HOSPITAL_COMMUNITY): Payer: Self-pay

## 2021-01-17 ENCOUNTER — Other Ambulatory Visit (HOSPITAL_COMMUNITY): Payer: Self-pay

## 2021-01-18 ENCOUNTER — Other Ambulatory Visit (HOSPITAL_COMMUNITY): Payer: Self-pay

## 2021-01-18 MED ORDER — ZOLPIDEM TARTRATE ER 12.5 MG PO TBCR
12.5000 mg | EXTENDED_RELEASE_TABLET | Freq: Every evening | ORAL | 5 refills | Status: DC
  Filled 2021-01-18: qty 60, 60d supply, fill #0

## 2021-02-11 ENCOUNTER — Other Ambulatory Visit: Payer: Self-pay

## 2021-02-11 ENCOUNTER — Emergency Department (HOSPITAL_BASED_OUTPATIENT_CLINIC_OR_DEPARTMENT_OTHER)
Admission: EM | Admit: 2021-02-11 | Discharge: 2021-02-11 | Disposition: A | Discharge status: Home / Self Care | Payer: 59 | Attending: Emergency Medicine | Admitting: Emergency Medicine

## 2021-02-11 ENCOUNTER — Encounter (HOSPITAL_BASED_OUTPATIENT_CLINIC_OR_DEPARTMENT_OTHER): Payer: Self-pay | Admitting: Emergency Medicine

## 2021-02-11 ENCOUNTER — Emergency Department (HOSPITAL_BASED_OUTPATIENT_CLINIC_OR_DEPARTMENT_OTHER): Payer: 59 | Admitting: Radiology

## 2021-02-11 DIAGNOSIS — S93402A Sprain of unspecified ligament of left ankle, initial encounter: Secondary | ICD-10-CM | POA: Insufficient documentation

## 2021-02-11 DIAGNOSIS — I10 Essential (primary) hypertension: Secondary | ICD-10-CM | POA: Insufficient documentation

## 2021-02-11 DIAGNOSIS — W108XXA Fall (on) (from) other stairs and steps, initial encounter: Secondary | ICD-10-CM | POA: Insufficient documentation

## 2021-02-11 DIAGNOSIS — S8991XA Unspecified injury of right lower leg, initial encounter: Secondary | ICD-10-CM | POA: Diagnosis present

## 2021-02-11 DIAGNOSIS — S92254A Nondisplaced fracture of navicular [scaphoid] of right foot, initial encounter for closed fracture: Secondary | ICD-10-CM | POA: Diagnosis not present

## 2021-02-11 DIAGNOSIS — Z79899 Other long term (current) drug therapy: Secondary | ICD-10-CM | POA: Diagnosis not present

## 2021-02-11 DIAGNOSIS — S92251A Displaced fracture of navicular [scaphoid] of right foot, initial encounter for closed fracture: Secondary | ICD-10-CM | POA: Diagnosis not present

## 2021-02-11 DIAGNOSIS — S93312A Subluxation of tarsal joint of left foot, initial encounter: Secondary | ICD-10-CM | POA: Diagnosis not present

## 2021-02-11 HISTORY — DX: Essential (primary) hypertension: I10

## 2021-02-11 MED ORDER — HYDROCODONE-ACETAMINOPHEN 5-325 MG PO TABS: 1.0000 | ORAL_TABLET | ORAL | 0 refills | Status: DC | PRN

## 2021-02-11 MED ORDER — IBUPROFEN 600 MG PO TABS: 600.0000 mg | ORAL_TABLET | Freq: Four times a day (QID) | ORAL | 0 refills | Status: DC | PRN

## 2021-02-11 NOTE — ED Provider Notes (Signed)
[facility]PT Provider Note   CSN: [id number] Arrival date & time: 02/11/21  1051     History Chief Complaint  Patient presents with   Charlotte [name] is a 60 y.o. female.  Pt presents to the ED today with right ankle and left foot pain.  Pt said she tripped on a step and hurt both legs.  She had to crawl to a chair.  She was able to walk very slowly with crutches on her heels.  She took 2 naprosyn pta.      Past Medical History:  Diagnosis Date   Anxiety    Arthritis    Hypertension     Patient Active Problem List   Diagnosis Date Noted   COUGH 09/14/2010   HYPERLIPIDEMIA 09/13/2010   ANXIETY DISORDER 09/13/2010   HYPERTENSION 09/13/2010   VASCULITIS 09/13/2010   G E R D 09/13/2010    Past Surgical History:  Procedure Laterality Date   ANTERIOR CERVICAL DECOMP/DISCECTOMY FUSION N/A 07/06/2013   Procedure: ANTERIOR CERVICAL DECOMPRESSION/DISCECTOMY FUSION 2 LEVEL/HARDWARE REMOVAL;  Surgeon: Erline Levine, MD;  Location: [facility];  Service: Neurosurgery;  Laterality: N/A;  C3-4 Anterior cervical decompression/diskectomy/fusion/removal of hardware at C6 with revision of Anterior cervical fusion at C5-6   BREAST SURGERY     right side   CARPAL TUNNEL RELEASE     bilateral   CERVICAL SPINE SURGERY     2004   ENDOMETRIAL ABLATION     EYE SURGERY     FINGER SURGERY     left 5th finger   MOUTH SURGERY     1970's     OB History   No obstetric history on file.     No family history on file.  Social History   Tobacco Use   Smoking status: Never   Smokeless tobacco: Never  Substance Use Topics   Alcohol use: Yes    Comment: social   Drug use: No    Home Medications Prior to Admission medications   Medication Sig Start Date End Date Taking? Authorizing Provider  HYDROcodone-acetaminophen (NORCO/VICODIN) 5-325 MG tablet Take 1 tablet by mouth every 4 (four) hours as needed. 02/11/21  Yes Isla Pence, MD   ibuprofen (ADVIL) 600 MG tablet Take 1 tablet (600 mg total) by mouth every 6 (six) hours as needed. 02/11/21  Yes Isla Pence, MD  ALPRAZolam Duanne Moron) 0.25 MG tablet Take 0.25-0.5 mg by mouth at bedtime as needed for anxiety or sleep.    [provider]  ALPRAZolam Duanne Moron) 0.5 MG tablet TAKE 1 TABLET BY MOUTH 3 TIMES DAILY 10/09/20 04/07/21  Maisie Fus, MD  ALPRAZolam Duanne Moron) 0.5 MG tablet Take 1 tablet (0.5 mg total) by mouth 3 (three) times daily as needed. 10/31/20   Maisie Fus, MD  citalopram (CELEXA) 20 MG tablet Take 20 mg by mouth daily.    [provider]  citalopram (CELEXA) 20 MG tablet TAKE 1 TABLET BY MOUTH ONCE A DAY 09/25/20 09/25/21  Antony Contras, MD  citalopram (CELEXA) 20 MG tablet TAKE 1 TABLET BY MOUTH ONCE A DAY 12/02/19 12/01/20  Antony Contras, MD  estradiol (DOTTI) 0.1 MG/24HR patch Place onto the skin every 2 weeks 11/01/20     estradiol (VIVELLE-DOT) 0.1 MG/24HR patch Place 1 patch onto the skin 2 (two) times a week.    [provider]  estradiol (VIVELLE-DOT) 0.1 MG/24HR patch APPLY 1 PATCH ON THE SKIN TWICE WEEKLY 10/25/20 10/25/21  Maisie Fus,  MD  losartan (COZAAR) 50 MG tablet TAKE 2 TABLETS (100MG ) BY MOUTH ONCE A DAY 09/25/20 09/25/21  Antony Contras, MD  methocarbamol (ROBAXIN) 500 MG tablet Take 1 tablet (500 mg total) by mouth 4 (four) times daily. 07/07/13   Erline Levine, MD  methocarbamol (ROBAXIN) 500 MG tablet TAKE 1 TABLET BY MOUTH EVERY 4 HOURS AS NEEDED 09/25/20 09/25/21  Antony Contras, MD  Nutritional Supplements (JUICE PLUS FIBRE PO) Take 1 capsule by mouth daily.    [provider]  progesterone (PROMETRIUM) 200 MG capsule TAKE 1 CAPSULE BY MOUTH AT BEDTIME 09/20/20 09/20/21  Maisie Fus, MD  progesterone (PROMETRIUM) 200 MG capsule Take 1 capsule (200 mg total) by mouth at bedtime for 12 days every cycle 11/01/20     zolpidem (AMBIEN CR) 12.5 MG CR tablet Take 12.5 mg by mouth at bedtime as needed for sleep.    [provider]  zolpidem (AMBIEN CR) 12.5 MG CR tablet Take 1 tablet (12.5 mg total) by mouth at bedtime. 10/31/20       Allergies    Hctz [hydrochlorothiazide], Lisinopril, and Statins  Review of Systems   Review of Systems  Musculoskeletal:        Right ankle/left foot pain  All other systems reviewed and are negative.  Physical Exam Updated Vital Signs BP (!) 186/95   Pulse 75   Temp 98.3 F (36.8 C) (Oral)   Resp 18   Ht 5' 8.5" (1.74 m)   Wt 92.1 kg   SpO2 98%   BMI 30.42 kg/m   Physical Exam Vitals and nursing note reviewed.  Constitutional:      Appearance: Normal appearance.  HENT:     Head: Normocephalic and atraumatic.     Right Ear: External ear normal.     Left Ear: External ear normal.     Nose: Nose normal.     Mouth/Throat:     Mouth: Mucous membranes are moist.     Pharynx: Oropharynx is clear.  Eyes:     Extraocular Movements: Extraocular movements intact.     Conjunctiva/sclera: Conjunctivae normal.     Pupils: Pupils are equal, round, and reactive to light.  Cardiovascular:     Rate and Rhythm: Normal rate and regular rhythm.     Pulses: Normal pulses.     Heart sounds: Normal heart sounds.  Pulmonary:     Effort: Pulmonary effort is normal.     Breath sounds: Normal breath sounds.  Abdominal:     General: Abdomen is flat. Bowel sounds are normal.     Palpations: Abdomen is soft.  Musculoskeletal:     Cervical back: Normal range of motion and neck supple.     Comments: Right lateral ankle tenderness, Left foot tenderness  Skin:    General: Skin is warm.     Capillary Refill: Capillary refill takes less than 2 seconds.  Neurological:     General: No focal deficit present.     Mental Status: She is alert and oriented to person, place, and time.  Psychiatric:        Mood and Affect: Mood normal.        Behavior: Behavior normal.    ED Results / Procedures / Treatments   Labs (all labs ordered are listed, but only abnormal results are  displayed) Labs Reviewed - No data to display  EKG None  Radiology DG Ankle Complete Right  Result Date: 02/11/2021 CLINICAL DATA:  Right ankle pain after a fall  today. EXAM: RIGHT ANKLE - COMPLETE 3+ VIEW COMPARISON:  Left foot radiographs today FINDINGS: There is an 8 mm bone fragment projecting along the dorsal aspect of the talonavicular joint on the lateral radiograph with regional soft tissue swelling most consistent with an avulsion fracture of the navicular. Mild calcaneocuboid subluxation is symmetrical compared to the left side. There is a small plantar calcaneal enthesophyte. IMPRESSION: Dorsal navicular fracture. Electronically Signed   By: Logan Bores M.D.   On: 02/11/2021 12:33   DG Foot Complete Left  Result Date: 02/11/2021 CLINICAL DATA:  Fall.  Left foot pain. EXAM: LEFT FOOT - COMPLETE 3+ VIEW COMPARISON:  Left ankle radiographs 10/21/2012 FINDINGS: No acute fracture or dislocation is identified. Mild calcaneocuboid subluxation is unchanged. The soft tissues are unremarkable. IMPRESSION: No acute osseous abnormality identified. Electronically Signed   By: Logan Bores M.D.   On: 02/11/2021 12:30    Procedures Procedures   Medications Ordered in ED Medications - No data to display  ED Course  I have reviewed the triage vital signs and the nursing notes.  Pertinent labs & imaging results that were available during my care of the patient were reviewed by me and considered in my medical decision making (see chart for details).    MDM Rules/Calculators/A&P                          Pt is placed in a cam walker to both legs.  She does have crutches at home and is given a rx for a walker if needed.  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  Return if worse.  F/u with pcp. Final Clinical Impression(s) / ED Diagnoses Final diagnoses:  Closed nondisplaced fracture of navicular bone of right foot, initial encounter  Sprain of left  ankle, unspecified ligament, initial encounter    Rx / DC Orders ED Discharge Orders          Ordered    HYDROcodone-acetaminophen (NORCO/VICODIN) 5-325 MG tablet  Every 4 hours PRN        02/11/21 1400    ibuprofen (ADVIL) 600 MG tablet  Every 6 hours PRN        02/11/21 1400    For home use only DME 4 wheeled rolling walker with seat        02/11/21 Mandan, Mazie Fencl, MD 02/11/21 1403

## 2021-02-11 NOTE — ED Notes (Addendum)
MD reviewed CAM Surveyor, mining and after talking with patient it was decided to discontinue the use of the Air Cast ankle brace and apply another CAM Walker.  Patient refused crutches due to having a set at home.

## 2021-02-11 NOTE — ED Triage Notes (Signed)
Pt fell down 1 step today. C/o R ankle and L foot pain. Denies LOC, did not hit her head.

## 2021-02-27 DIAGNOSIS — M79671 Pain in right foot: Secondary | ICD-10-CM | POA: Diagnosis not present

## 2021-02-27 DIAGNOSIS — M79672 Pain in left foot: Secondary | ICD-10-CM | POA: Diagnosis not present

## 2021-03-03 ENCOUNTER — Other Ambulatory Visit (HOSPITAL_COMMUNITY): Payer: Self-pay

## 2021-03-03 MED FILL — Losartan Potassium Tab 50 MG: ORAL | 90 days supply | Qty: 180 | Fill #1 | Status: AC

## 2021-03-05 ENCOUNTER — Other Ambulatory Visit (HOSPITAL_COMMUNITY): Payer: Self-pay

## 2021-03-23 ENCOUNTER — Other Ambulatory Visit (HOSPITAL_COMMUNITY): Payer: Self-pay

## 2021-03-23 DIAGNOSIS — M79672 Pain in left foot: Secondary | ICD-10-CM | POA: Diagnosis not present

## 2021-03-23 DIAGNOSIS — M79671 Pain in right foot: Secondary | ICD-10-CM | POA: Diagnosis not present

## 2021-03-23 MED ORDER — MELOXICAM 15 MG PO TABS
ORAL_TABLET | ORAL | 0 refills | Status: DC
  Filled 2021-03-23: qty 30, 30d supply, fill #0

## 2021-04-10 DIAGNOSIS — D1801 Hemangioma of skin and subcutaneous tissue: Secondary | ICD-10-CM | POA: Diagnosis not present

## 2021-04-10 DIAGNOSIS — D225 Melanocytic nevi of trunk: Secondary | ICD-10-CM | POA: Diagnosis not present

## 2021-04-10 DIAGNOSIS — L738 Other specified follicular disorders: Secondary | ICD-10-CM | POA: Diagnosis not present

## 2021-04-15 MED FILL — Citalopram Hydrobromide Tab 20 MG (Base Equiv): ORAL | 90 days supply | Qty: 90 | Fill #1 | Status: AC

## 2021-04-16 ENCOUNTER — Other Ambulatory Visit (HOSPITAL_COMMUNITY): Payer: Self-pay

## 2021-04-27 ENCOUNTER — Other Ambulatory Visit (HOSPITAL_COMMUNITY): Payer: Self-pay | Admitting: Orthopedic Surgery

## 2021-04-27 ENCOUNTER — Other Ambulatory Visit: Payer: Self-pay | Admitting: Orthopedic Surgery

## 2021-04-27 DIAGNOSIS — M79672 Pain in left foot: Secondary | ICD-10-CM

## 2021-04-28 ENCOUNTER — Other Ambulatory Visit: Payer: Self-pay

## 2021-04-28 ENCOUNTER — Ambulatory Visit (HOSPITAL_COMMUNITY)
Admission: RE | Admit: 2021-04-28 | Discharge: 2021-04-28 | Disposition: A | Discharge status: Home / Self Care | Payer: 59 | Source: Ambulatory Visit | Attending: Orthopedic Surgery | Admitting: Orthopedic Surgery

## 2021-04-28 DIAGNOSIS — M79672 Pain in left foot: Secondary | ICD-10-CM | POA: Diagnosis not present

## 2021-04-28 DIAGNOSIS — R600 Localized edema: Secondary | ICD-10-CM | POA: Diagnosis not present

## 2021-05-02 ENCOUNTER — Other Ambulatory Visit (HOSPITAL_COMMUNITY): Payer: Self-pay

## 2021-05-02 MED ORDER — MELOXICAM 15 MG PO TABS
ORAL_TABLET | ORAL | 3 refills | Status: DC
  Filled 2021-05-02: qty 30, 30d supply, fill #0
  Filled 2021-08-03: qty 30, 30d supply, fill #1

## 2021-05-29 ENCOUNTER — Other Ambulatory Visit (HOSPITAL_COMMUNITY): Payer: Self-pay

## 2021-06-04 ENCOUNTER — Other Ambulatory Visit (HOSPITAL_COMMUNITY): Payer: Self-pay

## 2021-06-04 MED FILL — Losartan Potassium Tab 50 MG: ORAL | 90 days supply | Qty: 180 | Fill #2 | Status: AC

## 2021-06-11 DIAGNOSIS — M2022 Hallux rigidus, left foot: Secondary | ICD-10-CM | POA: Diagnosis not present

## 2021-06-11 DIAGNOSIS — M79672 Pain in left foot: Secondary | ICD-10-CM | POA: Diagnosis not present

## 2021-06-11 DIAGNOSIS — M7742 Metatarsalgia, left foot: Secondary | ICD-10-CM | POA: Diagnosis not present

## 2021-06-12 ENCOUNTER — Other Ambulatory Visit (HOSPITAL_COMMUNITY): Payer: Self-pay | Admitting: Orthopedic Surgery

## 2021-06-22 ENCOUNTER — Other Ambulatory Visit (HOSPITAL_COMMUNITY): Payer: Self-pay

## 2021-06-24 ENCOUNTER — Other Ambulatory Visit (HOSPITAL_COMMUNITY): Payer: Self-pay

## 2021-06-25 ENCOUNTER — Other Ambulatory Visit (HOSPITAL_COMMUNITY): Payer: Self-pay

## 2021-06-26 ENCOUNTER — Other Ambulatory Visit (HOSPITAL_COMMUNITY): Payer: Self-pay

## 2021-06-26 MED ORDER — ALPRAZOLAM 0.5 MG PO TABS
ORAL_TABLET | ORAL | 0 refills | Status: DC
  Filled 2021-06-26: qty 90, 30d supply, fill #0

## 2021-06-26 MED ORDER — METHOCARBAMOL 500 MG PO TABS
ORAL_TABLET | ORAL | 0 refills | Status: DC
  Filled 2021-06-26: qty 30, 5d supply, fill #0

## 2021-07-16 ENCOUNTER — Other Ambulatory Visit (HOSPITAL_COMMUNITY): Payer: Self-pay

## 2021-07-16 MED FILL — Citalopram Hydrobromide Tab 20 MG (Base Equiv): ORAL | 90 days supply | Qty: 90 | Fill #2 | Status: AC

## 2021-07-26 ENCOUNTER — Encounter (HOSPITAL_BASED_OUTPATIENT_CLINIC_OR_DEPARTMENT_OTHER): Payer: Self-pay | Admitting: Orthopedic Surgery

## 2021-07-26 ENCOUNTER — Other Ambulatory Visit: Payer: Self-pay

## 2021-07-27 ENCOUNTER — Encounter (HOSPITAL_COMMUNITY)
Admission: RE | Admit: 2021-07-27 | Discharge: 2021-07-27 | Disposition: A | Discharge status: Home / Self Care | Payer: 59 | Source: Ambulatory Visit | Attending: Orthopedic Surgery | Admitting: Orthopedic Surgery

## 2021-07-27 DIAGNOSIS — Z01810 Encounter for preprocedural cardiovascular examination: Secondary | ICD-10-CM | POA: Insufficient documentation

## 2021-07-28 ENCOUNTER — Other Ambulatory Visit (HOSPITAL_COMMUNITY): Payer: Self-pay

## 2021-08-01 NOTE — Anesthesia Preprocedure Evaluation (Addendum)
Anesthesia Evaluation  Patient identified by MRN, date of birth, ID band Patient awake    Reviewed: Allergy & Precautions, H&P , NPO status , Patient's Chart, lab work & pertinent test results  Airway Mallampati: III  TM Distance: >3 FB Neck ROM: Full    Dental no notable dental hx. (+) Loose,    Pulmonary neg pulmonary ROS,    Pulmonary exam normal breath sounds clear to auscultation       Cardiovascular Exercise Tolerance: Good hypertension, Pt. on medications  Rhythm:Regular Rate:Normal     Neuro/Psych Anxiety negative neurological ROS     GI/Hepatic Neg liver ROS, GERD  ,  Endo/Other  negative endocrine ROS  Renal/GU negative Renal ROS  negative genitourinary   Musculoskeletal  (+) Arthritis , Osteoarthritis,    Abdominal   Peds  Hematology negative hematology ROS (+)   Anesthesia Other Findings   Reproductive/Obstetrics negative OB ROS                            Anesthesia Physical Anesthesia Plan  ASA: 2  Anesthesia Plan: General   Post-op Pain Management: Regional block and Tylenol PO (pre-op)   Induction: Intravenous  PONV Risk Score and Plan: 4 or greater and Ondansetron, Dexamethasone and Midazolam  Airway Management Planned: LMA  Additional Equipment:   Intra-op Plan:   Post-operative Plan: Extubation in OR  Informed Consent: I have reviewed the patients History and Physical, chart, labs and discussed the procedure including the risks, benefits and alternatives for the proposed anesthesia with the patient or authorized representative who has indicated his/her understanding and acceptance.     Dental advisory given  Plan Discussed with: CRNA  Anesthesia Plan Comments:       Anesthesia Quick Evaluation

## 2021-08-01 NOTE — Progress Notes (Signed)
Sent text reminding patient to come in for lab work.

## 2021-08-02 ENCOUNTER — Other Ambulatory Visit: Payer: Self-pay

## 2021-08-02 ENCOUNTER — Encounter (HOSPITAL_BASED_OUTPATIENT_CLINIC_OR_DEPARTMENT_OTHER)
Admission: RE | Disposition: A | Discharge status: Home / Self Care | Payer: Self-pay | Source: Home / Self Care | Attending: Orthopedic Surgery

## 2021-08-02 ENCOUNTER — Other Ambulatory Visit (HOSPITAL_COMMUNITY): Payer: Self-pay

## 2021-08-02 ENCOUNTER — Encounter (HOSPITAL_BASED_OUTPATIENT_CLINIC_OR_DEPARTMENT_OTHER): Payer: Self-pay | Admitting: Orthopedic Surgery

## 2021-08-02 ENCOUNTER — Ambulatory Visit (HOSPITAL_BASED_OUTPATIENT_CLINIC_OR_DEPARTMENT_OTHER): Payer: 59

## 2021-08-02 ENCOUNTER — Ambulatory Visit (HOSPITAL_BASED_OUTPATIENT_CLINIC_OR_DEPARTMENT_OTHER)
Admission: RE | Admit: 2021-08-02 | Discharge: 2021-08-02 | Disposition: A | Discharge status: Home / Self Care | Payer: 59 | Attending: Orthopedic Surgery | Admitting: Orthopedic Surgery

## 2021-08-02 ENCOUNTER — Ambulatory Visit (HOSPITAL_BASED_OUTPATIENT_CLINIC_OR_DEPARTMENT_OTHER): Payer: 59 | Admitting: Anesthesiology

## 2021-08-02 DIAGNOSIS — K219 Gastro-esophageal reflux disease without esophagitis: Secondary | ICD-10-CM | POA: Insufficient documentation

## 2021-08-02 DIAGNOSIS — M7742 Metatarsalgia, left foot: Secondary | ICD-10-CM | POA: Diagnosis not present

## 2021-08-02 DIAGNOSIS — M2012 Hallux valgus (acquired), left foot: Secondary | ICD-10-CM | POA: Diagnosis not present

## 2021-08-02 DIAGNOSIS — M1990 Unspecified osteoarthritis, unspecified site: Secondary | ICD-10-CM | POA: Insufficient documentation

## 2021-08-02 DIAGNOSIS — M2032 Hallux varus (acquired), left foot: Secondary | ICD-10-CM | POA: Insufficient documentation

## 2021-08-02 DIAGNOSIS — I10 Essential (primary) hypertension: Secondary | ICD-10-CM | POA: Insufficient documentation

## 2021-08-02 DIAGNOSIS — M2022 Hallux rigidus, left foot: Secondary | ICD-10-CM | POA: Diagnosis not present

## 2021-08-02 DIAGNOSIS — F419 Anxiety disorder, unspecified: Secondary | ICD-10-CM | POA: Insufficient documentation

## 2021-08-02 DIAGNOSIS — M2042 Other hammer toe(s) (acquired), left foot: Secondary | ICD-10-CM | POA: Diagnosis not present

## 2021-08-02 DIAGNOSIS — M25375 Other instability, left foot: Secondary | ICD-10-CM | POA: Insufficient documentation

## 2021-08-02 DIAGNOSIS — G8918 Other acute postprocedural pain: Secondary | ICD-10-CM | POA: Diagnosis not present

## 2021-08-02 HISTORY — PX: ARTHRODESIS METATARSALPHALANGEAL JOINT (MTPJ): SHX6566

## 2021-08-02 HISTORY — DX: Gastro-esophageal reflux disease without esophagitis: K21.9

## 2021-08-02 HISTORY — PX: WEIL OSTEOTOMY: SHX5044

## 2021-08-02 SURGERY — FUSION, JOINT, GREAT TOE
Anesthesia: Regional | Site: Foot | Laterality: Left

## 2021-08-02 MED ORDER — ACETAMINOPHEN 500 MG PO TABS
1000.0000 mg | ORAL_TABLET | Freq: Once | ORAL | Status: AC
  Administered 2021-08-02: 1000 mg via ORAL

## 2021-08-02 MED ORDER — MIDAZOLAM HCL 2 MG/2ML IJ SOLN
INTRAMUSCULAR | Status: AC
  Filled 2021-08-02: qty 2

## 2021-08-02 MED ORDER — FENTANYL CITRATE (PF) 100 MCG/2ML IJ SOLN
INTRAMUSCULAR | Status: AC
  Filled 2021-08-02: qty 2

## 2021-08-02 MED ORDER — LACTATED RINGERS IV SOLN: INTRAVENOUS | Status: DC

## 2021-08-02 MED ORDER — ACETAMINOPHEN 500 MG PO TABS
ORAL_TABLET | ORAL | Status: AC
  Filled 2021-08-02: qty 2

## 2021-08-02 MED ORDER — SODIUM CHLORIDE 0.9 % IV SOLN: INTRAVENOUS | Status: DC

## 2021-08-02 MED ORDER — LIDOCAINE 2% (20 MG/ML) 5 ML SYRINGE
INTRAMUSCULAR | Status: DC | PRN
  Administered 2021-08-02: 30 mg via INTRAVENOUS

## 2021-08-02 MED ORDER — SENNA 8.6 MG PO TABS
2.0000 | ORAL_TABLET | Freq: Two times a day (BID) | ORAL | 0 refills | Status: DC
  Filled 2021-08-02: qty 30, 8d supply, fill #0

## 2021-08-02 MED ORDER — VANCOMYCIN HCL 500 MG IV SOLR
INTRAVENOUS | Status: DC | PRN
  Administered 2021-08-02: 500 mg via TOPICAL

## 2021-08-02 MED ORDER — PROPOFOL 10 MG/ML IV BOLUS
INTRAVENOUS | Status: DC | PRN
  Administered 2021-08-02: 200 mg via INTRAVENOUS

## 2021-08-02 MED ORDER — BUPIVACAINE-EPINEPHRINE (PF) 0.5% -1:200000 IJ SOLN
INTRAMUSCULAR | Status: DC | PRN
  Administered 2021-08-02: 30 mL via PERINEURAL

## 2021-08-02 MED ORDER — CEFAZOLIN SODIUM-DEXTROSE 2-4 GM/100ML-% IV SOLN
INTRAVENOUS | Status: AC
  Filled 2021-08-02: qty 100

## 2021-08-02 MED ORDER — FENTANYL CITRATE (PF) 100 MCG/2ML IJ SOLN
100.0000 ug | Freq: Once | INTRAMUSCULAR | Status: AC
  Administered 2021-08-02: 100 ug via INTRAVENOUS

## 2021-08-02 MED ORDER — MIDAZOLAM HCL 2 MG/2ML IJ SOLN
2.0000 mg | Freq: Once | INTRAMUSCULAR | Status: AC
  Administered 2021-08-02: 2 mg via INTRAVENOUS

## 2021-08-02 MED ORDER — DEXAMETHASONE SODIUM PHOSPHATE 10 MG/ML IJ SOLN
INTRAMUSCULAR | Status: DC | PRN
  Administered 2021-08-02: 5 mg via INTRAVENOUS

## 2021-08-02 MED ORDER — DOCUSATE SODIUM 100 MG PO CAPS
100.0000 mg | ORAL_CAPSULE | Freq: Two times a day (BID) | ORAL | 0 refills | Status: DC
  Filled 2021-08-02: qty 30, 15d supply, fill #0

## 2021-08-02 MED ORDER — ONDANSETRON HCL 4 MG/2ML IJ SOLN
INTRAMUSCULAR | Status: DC | PRN
  Administered 2021-08-02: 4 mg via INTRAVENOUS

## 2021-08-02 MED ORDER — 0.9 % SODIUM CHLORIDE (POUR BTL) OPTIME
TOPICAL | Status: DC | PRN
  Administered 2021-08-02: 300 mL

## 2021-08-02 MED ORDER — CEFAZOLIN SODIUM-DEXTROSE 2-4 GM/100ML-% IV SOLN
2.0000 g | INTRAVENOUS | Status: AC
  Administered 2021-08-02: 2 g via INTRAVENOUS

## 2021-08-02 MED ORDER — HYDROMORPHONE HCL 1 MG/ML IJ SOLN: 0.2500 mg | INTRAMUSCULAR | Status: DC | PRN

## 2021-08-02 MED ORDER — PROPOFOL 500 MG/50ML IV EMUL
INTRAVENOUS | Status: DC | PRN
  Administered 2021-08-02: 25 ug/kg/min via INTRAVENOUS

## 2021-08-02 MED ORDER — VANCOMYCIN HCL 500 MG IV SOLR
INTRAVENOUS | Status: AC
  Filled 2021-08-02: qty 10

## 2021-08-02 MED ORDER — OXYCODONE HCL 5 MG PO TABS
5.0000 mg | ORAL_TABLET | ORAL | 0 refills | Status: AC | PRN
  Filled 2021-08-02: qty 18, 3d supply, fill #0

## 2021-08-02 SURGICAL SUPPLY — 90 items
APL PRP STRL LF DISP 70% ISPRP (MISCELLANEOUS) ×1
BANDAGE ESMARK 6X9 LF (GAUZE/BANDAGES/DRESSINGS) IMPLANT
BIT DRILL CAL 2.5 ST W/SLV (BIT) ×2 IMPLANT
BLADE AVERAGE 25MMX9MM (BLADE)
BLADE AVERAGE 25X9 (BLADE) IMPLANT
BLADE LONG MED 25X9 (BLADE) ×3 IMPLANT
BLADE LONG MED 25X9MM (BLADE) ×1
BLADE MICRO SAGITTAL (BLADE) IMPLANT
BLADE OSC/SAG .038X5.5 CUT EDG (BLADE) IMPLANT
BLADE SURG 15 STRL LF DISP TIS (BLADE) ×4 IMPLANT
BLADE SURG 15 STRL SS (BLADE) ×12
BNDG CMPR 9X4 STRL LF SNTH (GAUZE/BANDAGES/DRESSINGS)
BNDG CMPR 9X6 STRL LF SNTH (GAUZE/BANDAGES/DRESSINGS)
BNDG COHESIVE 4X5 TAN ST LF (GAUZE/BANDAGES/DRESSINGS) ×2 IMPLANT
BNDG COHESIVE 6X5 TAN ST LF (GAUZE/BANDAGES/DRESSINGS) IMPLANT
BNDG CONFORM 2 STRL LF (GAUZE/BANDAGES/DRESSINGS) IMPLANT
BNDG CONFORM 3 STRL LF (GAUZE/BANDAGES/DRESSINGS) ×4 IMPLANT
BNDG ELASTIC 4X5.8 VLCR STR LF (GAUZE/BANDAGES/DRESSINGS) ×4 IMPLANT
BNDG ESMARK 4X9 LF (GAUZE/BANDAGES/DRESSINGS) IMPLANT
BNDG ESMARK 6X9 LF (GAUZE/BANDAGES/DRESSINGS)
BOOT STEPPER DURA LG (SOFTGOODS) IMPLANT
BOOT STEPPER DURA MED (SOFTGOODS) ×2 IMPLANT
BOOT STEPPER DURA SM (SOFTGOODS) IMPLANT
BOOT STEPPER DURA XLG (SOFTGOODS) IMPLANT
CHLORAPREP W/TINT 26 (MISCELLANEOUS) ×4 IMPLANT
COVER BACK TABLE 60X90IN (DRAPES) ×4 IMPLANT
CUFF TOURN SGL QUICK 24 (TOURNIQUET CUFF)
CUFF TOURN SGL QUICK 34 (TOURNIQUET CUFF)
CUFF TRNQT CYL 24X4X16.5-23 (TOURNIQUET CUFF) IMPLANT
CUFF TRNQT CYL 34X4.125X (TOURNIQUET CUFF) IMPLANT
DRAPE EXTREMITY T 121X128X90 (DISPOSABLE) ×4 IMPLANT
DRAPE OEC MINIVIEW 54X84 (DRAPES) ×4 IMPLANT
DRAPE U-SHAPE 47X51 STRL (DRAPES) ×4 IMPLANT
DRSG MEPITEL 4X7.2 (GAUZE/BANDAGES/DRESSINGS) ×4 IMPLANT
DRSG PAD ABDOMINAL 8X10 ST (GAUZE/BANDAGES/DRESSINGS) ×4 IMPLANT
ELECT REM PT RETURN 9FT ADLT (ELECTROSURGICAL) ×3
ELECTRODE REM PT RTRN 9FT ADLT (ELECTROSURGICAL) ×2 IMPLANT
GAUZE SPONGE 4X4 12PLY STRL (GAUZE/BANDAGES/DRESSINGS) ×4 IMPLANT
GLOVE SRG 8 PF TXTR STRL LF DI (GLOVE) ×4 IMPLANT
GLOVE SURG ENC MOIS LTX SZ8 (GLOVE) ×4 IMPLANT
GLOVE SURG LTX SZ8 (GLOVE) ×4 IMPLANT
GLOVE SURG POLYISO LF SZ7 (GLOVE) ×2 IMPLANT
GLOVE SURG UNDER POLY LF SZ7 (GLOVE) ×4 IMPLANT
GLOVE SURG UNDER POLY LF SZ8 (GLOVE) ×6
GOWN STRL REUS W/ TWL LRG LVL3 (GOWN DISPOSABLE) ×2 IMPLANT
GOWN STRL REUS W/ TWL XL LVL3 (GOWN DISPOSABLE) ×4 IMPLANT
GOWN STRL REUS W/TWL LRG LVL3 (GOWN DISPOSABLE) ×3
GOWN STRL REUS W/TWL XL LVL3 (GOWN DISPOSABLE) ×6
K-WIRE ACE 1.6X6 (WIRE) ×3
KIT INSTRUMENT MPJ STRATUM (KITS) ×2 IMPLANT
KWIRE ACE 1.6X6 (WIRE) IMPLANT
NEEDLE HYPO 22GX1.5 SAFETY (NEEDLE) IMPLANT
NS IRRIG 1000ML POUR BTL (IV SOLUTION) ×4 IMPLANT
PACK BASIN DAY SURGERY FS (CUSTOM PROCEDURE TRAY) ×4 IMPLANT
PAD CAST 4YDX4 CTTN HI CHSV (CAST SUPPLIES) ×2 IMPLANT
PADDING CAST ABS 4INX4YD NS (CAST SUPPLIES)
PADDING CAST ABS COTTON 4X4 ST (CAST SUPPLIES) IMPLANT
PADDING CAST COTTON 4X4 STRL (CAST SUPPLIES) ×3
PADDING CAST COTTON 6X4 STRL (CAST SUPPLIES) IMPLANT
PENCIL SMOKE EVACUATOR (MISCELLANEOUS) ×4 IMPLANT
PLATE MPJ 1ST STRM SM 0D LT (Plate) ×2 IMPLANT
SANITIZER HAND PURELL 535ML FO (MISCELLANEOUS) ×4 IMPLANT
SCREW HCS TWIST-OFF 2.0X12MM (Screw) ×4 IMPLANT
SCREW LOCK STRATUM 3.5X16 (Screw) ×2 IMPLANT
SCREW LOCK STRATUM 3.5X18 (Screw) ×6 IMPLANT
SCREW NLOCK LP ST STRM 3.5X14 (Screw) ×2 IMPLANT
SCREW STRM NL LP 3.5X20 (Screw) ×2 IMPLANT
SHEET MEDIUM DRAPE 40X70 STRL (DRAPES) ×4 IMPLANT
SLEEVE SCD COMPRESS KNEE MED (STOCKING) ×4 IMPLANT
SPLINT FAST PLASTER 5X30 (CAST SUPPLIES)
SPLINT PLASTER CAST FAST 5X30 (CAST SUPPLIES) IMPLANT
SPONGE SURGIFOAM ABS GEL 12-7 (HEMOSTASIS) IMPLANT
SPONGE T-LAP 18X18 ~~LOC~~+RFID (SPONGE) ×4 IMPLANT
STOCKINETTE 6  STRL (DRAPES) ×3
STOCKINETTE 6 STRL (DRAPES) ×2 IMPLANT
SUCTION FRAZIER HANDLE 10FR (MISCELLANEOUS) ×3
SUCTION TUBE FRAZIER 10FR DISP (MISCELLANEOUS) ×2 IMPLANT
SUT ETHILON 3 0 PS 1 (SUTURE) ×6 IMPLANT
SUT MNCRL AB 3-0 PS2 18 (SUTURE) ×4 IMPLANT
SUT VIC AB 2-0 SH 27 (SUTURE) ×3
SUT VIC AB 2-0 SH 27XBRD (SUTURE) ×2 IMPLANT
SUT VICRYL 0 SH 27 (SUTURE) IMPLANT
SUT VICRYL 0 UR6 27IN ABS (SUTURE) ×2 IMPLANT
SYR BULB EAR ULCER 3OZ GRN STR (SYRINGE) ×4 IMPLANT
SYR CONTROL 10ML LL (SYRINGE) IMPLANT
TOWEL GREEN STERILE FF (TOWEL DISPOSABLE) ×8 IMPLANT
TUBE CONNECTING 20'X1/4 (TUBING) ×1
TUBE CONNECTING 20X1/4 (TUBING) ×3 IMPLANT
UNDERPAD 30X36 HEAVY ABSORB (UNDERPADS AND DIAPERS) ×4 IMPLANT
YANKAUER SUCT BULB TIP NO VENT (SUCTIONS) IMPLANT

## 2021-08-02 NOTE — Anesthesia Procedure Notes (Signed)
Procedure Name: LMA Insertion Date/Time: 08/02/2021 9:15 AM Performed by: Signe Colt, CRNA Pre-anesthesia Checklist: Patient identified, Emergency Drugs available, Suction available and Patient being monitored Patient Re-evaluated:Patient Re-evaluated prior to induction Oxygen Delivery Method: Circle System Utilized Preoxygenation: Pre-oxygenation with 100% oxygen Induction Type: IV induction Ventilation: Mask ventilation without difficulty LMA: LMA with gastric port inserted LMA Size: 3.0 Number of attempts: 1 Placement Confirmation: positive ETCO2 Tube secured with: Tape Dental Injury: Teeth and Oropharynx as per pre-operative assessment

## 2021-08-02 NOTE — Anesthesia Procedure Notes (Signed)
Anesthesia Regional Block: Popliteal block   Pre-Anesthetic Checklist: , timeout performed,  Correct Patient, Correct Site, Correct Laterality,  Correct Procedure, Correct Position, site marked,  Risks and benefits discussed,  Pre-op evaluation,  At surgeon's request and post-op pain management  Laterality: Left  Prep: Maximum Sterile Barrier Precautions used, chloraprep       Needles:  Injection technique: Single-shot  Needle Type: Echogenic Stimulator Needle     Needle Length: 9cm  Needle Gauge: 21     Additional Needles:   Procedures:,,,, ultrasound used (permanent image in chart),,    Narrative:  Start time: 08/02/2021 7:55 AM End time: 08/02/2021 8:05 AM Injection made incrementally with aspirations every 5 mL.  Performed by: Personally  Anesthesiologist: Roderic Palau, MD

## 2021-08-02 NOTE — Op Note (Signed)
08/02/2021  10:32 AM  PATIENT:  Charlotte [name]  60 y.o. female  PRE-OPERATIVE DIAGNOSIS: 1.  Left foot hallux rigidus and hallux varus 2.  Left forefoot metatarsalgia with second and third MTP joint instability  POST-OPERATIVE DIAGNOSIS: Same  Procedure(s): 1.  Left hallux MP joint arthrodesis 2.  Left second and third metatarsal Weil osteotomies through separate incisions 3.  Left second and third MTP joint lateral collateral ligament repairs 4.  AP, oblique and lateral radiographs of the left foot  SURGEON:  Wylene Simmer, MD  ASSISTANT: Mechele Claude, PA-C  ANESTHESIA:   General, regional  EBL:  minimal   TOURNIQUET:   Total Tourniquet Time Documented: Thigh (Left) - 54 minutes Total: Thigh (Left) - 54 minutes  COMPLICATIONS:  None apparent  DISPOSITION:  Extubated, awake and stable to recovery.  INDICATION FOR PROCEDURE: The patient is a 60 year old female with a long history of left forefoot pain.  She has hallux varus as well as hallux rigidus.  She also has signs and symptoms of metatarsalgia with instability of the second and third MTP joints with lateral collateral ligament insufficiency.  She has failed nonoperative treatment to date including activity modification, oral anti-inflammatories and shoewear modification.  She presents now for surgical treatment of these painful left forefoot conditions.  The risks and benefits of the alternative treatment options have been discussed in detail.  The patient wishes to proceed with surgery and specifically understands risks of bleeding, infection, nerve damage, blood clots, need for additional surgery, amputation and death.   PROCEDURE IN DETAIL:  After pre operative consent was obtained, and the correct operative site was identified, the patient was brought to the operating room and placed supine on the OR table.  Anesthesia was administered.  Pre-operative antibiotics were administered.  A surgical timeout was taken.  The left  lower extremity was prepped and draped in standard sterile fashion with a tourniquet around the thigh.  The extremity was elevated and the tourniquet was inflated to 250 mmHg.  A longitudinal incision was made over the hallux MP joint.  Dissection was carried sharply down through the subcutaneous tissues.  The dorsal joint capsule was incised and elevated medially and laterally.  The collateral ligaments were released exposing the metatarsal head.  A concave reamer was used to remove the remaining articular cartilage and subchondral bone.  A convex reamer was used to remove the remaining articular cartilage and subchondral bone from the base of the proximal phalanx.  The wound was irrigated copiously.  Both sides of the joint were perforated with a small drill bit.  The joint was reduced and provisionally pinned.  Radiographs and simulated weightbearing examination showed appropriate alignment of the hallux MP joint.  The joint was then fixed with a small left [employer] stratum compression plate.  Locking and nonlocking screws were used to fix the plate after adequate compression was achieved across the joint.  Radiographs confirmed appropriate reduction of the joint and appropriate position of the plate and all screws.  Attention was then turned to the second MTP joint.  A longitudinal incision was made and dissection carried down through the subcutaneous tissues.  The joint capsule was incised and elevated medially and laterally.  The medial collateral ligament was released.  A Weil osteotomy was made with the oscillating saw.  The head of the metatarsal was allowed to retract proximally several millimeters.  It was fixed with a 2 mm [employer] FRS screw.  Overhanging bone was trimmed with a  rondure.  A 0 Vicryl box suture was placed in the lateral collateral ligament and tagged for later tying.  The same procedure was then performed for the third MTP joint shortening the second metatarsal, releasing  the medial collateral ligament and placing a suture through the lateral collateral ligament.  The second and third toes were then reduced into appropriate alignment.  The lateral collateral ligament sutures were tied securely.  Final AP, lateral and oblique radiographs of the left foot were obtained intraoperatively.  These show interval arthrodesis of the hallux MP joint and shortening of the second third metatarsals.  Hardware is appropriately positioned and of the appropriate lengths.  No acute injuries were noted.  The wounds were irrigated copiously and sprinkled with vancomycin powder.  Subcutaneous tissues were approximated with Monocryl.  Skin incisions were closed with nylon.  Sterile dressings were applied followed by a well-padded compression wrap and a cam boot.  The tourniquet was released after application of the dressings.  The patient was awakened from anesthesia and transported to the recovery room in stable condition.   FOLLOW UP PLAN: Weightbearing as tolerated on the heel in the cam boot for the next 2 weeks.  Follow-up in 2 weeks for suture removal and continued weightbearing immobilization in the cam boot for another month.  No indication for DVT prophylaxis in this ambulatory patient.   RADIOGRAPHS: AP, lateral and oblique radiographs of the left foot were obtained intraoperatively.  These show interval arthrodesis of the hallux MP joint and shortening of the second third metatarsals.  Hardware is appropriately positioned and of the appropriate lengths.  No acute injuries were noted.     Mechele Claude PA-C was present and scrubbed for the duration of the operative case. His assistance was essential in positioning the patient, prepping and draping, gaining and maintaining exposure, performing the operation, closing and dressing the wounds and applying the splint.

## 2021-08-02 NOTE — Anesthesia Postprocedure Evaluation (Signed)
Anesthesia Post Note  Patient: Charlotte [name]  Procedure(s) Performed: Left hallux metatarsophalangeal joint arthrodesis (Left: Foot) 2-3 Weil osteotomies; lateral collateral ligament repairs (Left: Foot)     Patient location during evaluation: PACU Anesthesia Type: Regional and General Level of consciousness: awake and alert Pain management: pain level controlled Vital Signs Assessment: post-procedure vital signs reviewed and stable Respiratory status: spontaneous breathing, nonlabored ventilation and respiratory function stable Cardiovascular status: blood pressure returned to baseline and stable Postop Assessment: no apparent nausea or vomiting Anesthetic complications: no   No notable events documented.  Last Vitals:  Vitals:   08/02/21 1100 08/02/21 1126  BP: (!) 146/76 (!) 155/86  Pulse: 76 72  Resp: 20 14  Temp:  36.6 C  SpO2: 94% 96%    Last Pain:  Vitals:   08/02/21 1126  TempSrc:   PainSc: 0-No pain                 Jamaris Biernat,W. EDMOND

## 2021-08-02 NOTE — H&P (Signed)
Charlotte [name] is an 60 y.o. female.   Chief Complaint: Left foot pain HPI: 60 year old female without significant past medical history complains of a long history of left forefoot pain.  She has hallux rigidus and second and third hammertoes with MTP joint instability and metatarsalgia.  She has failed nonoperative treatment to date including activity modification, oral anti-inflammatories and shoewear modification.  Past Medical History:  Diagnosis Date   Anxiety    Arthritis    GERD (gastroesophageal reflux disease)    Hypertension     Past Surgical History:  Procedure Laterality Date   ANTERIOR CERVICAL DECOMP/DISCECTOMY FUSION N/A 07/06/2013   Procedure: ANTERIOR CERVICAL DECOMPRESSION/DISCECTOMY FUSION 2 LEVEL/HARDWARE REMOVAL;  Surgeon: Erline Levine, MD;  Location: [facility];  Service: Neurosurgery;  Laterality: N/A;  C3-4 Anterior cervical decompression/diskectomy/fusion/removal of hardware at C6 with revision of Anterior cervical fusion at C5-6   BREAST SURGERY     right side   CARPAL TUNNEL RELEASE     bilateral   CERVICAL SPINE SURGERY     2004   ENDOMETRIAL ABLATION     EYE SURGERY     FINGER SURGERY     left 5th finger   MOUTH SURGERY     1970's    History reviewed. No pertinent family history. Social History:  reports that she has never smoked. She has never used smokeless tobacco. She reports current alcohol use. She reports that she does not use drugs.  Allergies:  Allergies  Allergen Reactions   Hctz [Hydrochlorothiazide]     INEFFECTIVE FOR BP   Lisinopril Other (See Comments)    REACTION: cough   Statins     CONSTIPATION     Medications Prior to Admission  Medication Sig Dispense Refill   ALPRAZolam (XANAX) 0.5 MG tablet Take 1 tablet by mouth 3 times a day 90 tablet 0   citalopram (CELEXA) 20 MG tablet TAKE 1 TABLET BY MOUTH ONCE A DAY 90 tablet 3   estradiol (DOTTI) 0.1 MG/24HR patch Place onto the skin every 2 weeks 24 patch 4   losartan  (COZAAR) 50 MG tablet TAKE 2 TABLETS (100MG ) BY MOUTH ONCE A DAY 180 tablet 3   methocarbamol (ROBAXIN) 500 MG tablet Take 1 tablet (500 mg total) by mouth 4 (four) times daily. 60 tablet 3   methocarbamol (ROBAXIN) 500 MG tablet Take 1 tablet by mouth every 4 hours as needed. 30 tablet 0   progesterone (PROMETRIUM) 200 MG capsule TAKE 1 CAPSULE BY MOUTH AT BEDTIME 30 capsule 1   progesterone (PROMETRIUM) 200 MG capsule Take 1 capsule (200 mg total) by mouth at bedtime for 12 days every cycle 36 capsule 4   ALPRAZolam (XANAX) 0.25 MG tablet Take 0.25-0.5 mg by mouth at bedtime as needed for anxiety or sleep.     ALPRAZolam (XANAX) 0.5 MG tablet Take 1 tablet (0.5 mg total) by mouth 3 (three) times daily as needed. 90 tablet 5   citalopram (CELEXA) 20 MG tablet Take 20 mg by mouth daily.     citalopram (CELEXA) 20 MG tablet TAKE 1 TABLET BY MOUTH ONCE A DAY 90 tablet 3   estradiol (VIVELLE-DOT) 0.1 MG/24HR patch Place 1 patch onto the skin 2 (two) times a week.     estradiol (VIVELLE-DOT) 0.1 MG/24HR patch APPLY 1 PATCH ON THE SKIN TWICE WEEKLY 24 patch 0   HYDROcodone-acetaminophen (NORCO/VICODIN) 5-325 MG tablet Take 1 tablet by mouth every 4 (four) hours as needed. 10 tablet 0   ibuprofen (ADVIL)  600 MG tablet Take 1 tablet (600 mg total) by mouth every 6 (six) hours as needed. 30 tablet 0   meloxicam (MOBIC) 15 MG tablet Take 1 tablet by mouth daily as needed 30 tablet 3   methocarbamol (ROBAXIN) 500 MG tablet TAKE 1 TABLET BY MOUTH EVERY 4 HOURS AS NEEDED 30 tablet 0   Nutritional Supplements (JUICE PLUS FIBRE PO) Take 1 capsule by mouth daily.     zolpidem (AMBIEN CR) 12.5 MG CR tablet Take 12.5 mg by mouth at bedtime as needed for sleep.     zolpidem (AMBIEN CR) 12.5 MG CR tablet Take 1 tablet (12.5 mg total) by mouth at bedtime. 60 tablet 5    No results found for this or any previous visit (from the past 48 hour(s)). No results found.  Review of Systems no recent fever, chills, nausea,  vomiting or changes in her appetite  Blood pressure 108/64, pulse 68, temperature 98.4 F (36.9 C), temperature source Oral, resp. rate 12, height 5' 8.5" (1.74 m), weight 97.9 kg, SpO2 96 %. Physical Exam  Well-nourished well-developed woman in no apparent distress.  Alert and oriented x4.  Normal mood and affect.  Extraocular motions are intact.  Respirations are unlabored.  Gait is antalgic to the left.  Left foot has decreased range of motion of the hallux MP joint.  Second and third hammertoe deformities are noted as well as instability at the second and third MTP joints.  Assessment/Plan Left hallux rigidus, metatarsalgia and second and third hammertoe deformities with MTP joint instability -to the operating room today for surgical treatment.  The risks and benefits of the alternative treatment options have been discussed in detail.  The patient wishes to proceed with surgery and specifically understands risks of bleeding, infection, nerve damage, blood clots, need for additional surgery, amputation and death.   Wylene Simmer, MD August 24, 2021, 8:50 AM

## 2021-08-02 NOTE — Discharge Instructions (Addendum)
Charlotte Simmer, MD [facility]  Please read the following information regarding your care after surgery.  Medications  You only need a prescription for the narcotic pain medicine (ex. oxycodone, Percocet, Norco).  All of the other medicines listed below are available over the counter. ? Mobic that you have at home, as prescribed,  for the first 3 days after surgery. ? acetominophen (Tylenol) 650 mg every 4-6 hours as you need for minor to moderate pain ? oxycodone as prescribed for severe pain  Narcotic pain medicine (ex. oxycodone, Percocet, Vicodin) will cause constipation.  To prevent this problem, take the following medicines while you are taking any pain medicine. ? docusate sodium (Colace) 100 mg twice a day ? senna (Senokot) 2 tablets twice a day   Weight Bearing ? Bear weight only on your operated foot in the CAM boot.   Cast / Splint / Dressing ? Keep your splint, cast or dressing clean and dry.  Dont put anything (coat hanger, pencil, etc) down inside of it.  If it gets damp, use a hair dryer on the cool setting to dry it.  If it gets soaked, call the office to schedule an appointment for a cast change.   After your dressing, cast or splint is removed; you may shower, but do not soak or scrub the wound.  Allow the water to run over it, and then gently pat it dry.  Swelling It is normal for you to have swelling where you had surgery.  To reduce swelling and pain, keep your toes above your nose for at least 3 days after surgery.  It may be necessary to keep your foot or leg elevated for several weeks.  If it hurts, it should be elevated.  Follow Up Call my office at [phone] when you are discharged from the hospital or surgery center to schedule an appointment to be seen two weeks after surgery.  Call my office at [phone] if you develop a fever >101.5 F, nausea, vomiting, bleeding from the surgical site or severe pain.      May take Tylenol after 1:30 pm, if needed.     Post Anesthesia Home Care Instructions  Activity: Get plenty of rest for the remainder of the day. A responsible individual must stay with you for 24 hours following the procedure.  For the next 24 hours, DO NOT: -Drive a car -Paediatric nurse -Drink alcoholic beverages -Take any medication unless instructed by your physician -Make any legal decisions or sign important papers.  Meals: Start with liquid foods such as gelatin or soup. Progress to regular foods as tolerated. Avoid greasy, spicy, heavy foods. If nausea and/or vomiting occur, drink only clear liquids until the nausea and/or vomiting subsides. Call your physician if vomiting continues.  Special Instructions/Symptoms: Your throat may feel dry or sore from the anesthesia or the breathing tube placed in your throat during surgery. If this causes discomfort, gargle with warm salt water. The discomfort should disappear within 24 hours.  If you had a scopolamine patch placed behind your ear for the management of post- operative nausea and/or vomiting:  1. The medication in the patch is effective for 72 hours, after which it should be removed.  Wrap patch in a tissue and discard in the trash. Wash hands thoroughly with soap and water. 2. You may remove the patch earlier than 72 hours if you experience unpleasant side effects which may include dry mouth, dizziness or visual disturbances. 3. Avoid touching the patch. Wash your hands with  soap and water after contact with the patch.    Regional Anesthesia Blocks  1. Numbness or the inability to move the "blocked" extremity may last from 3-48 hours after placement. The length of time depends on the medication injected and your individual response to the medication. If the numbness is not going away after 48 hours, call your surgeon.  2. The extremity that is blocked will need to be protected until the numbness is gone and the  Strength has returned. Because you cannot feel it, you  will need to take extra care to avoid injury. Because it may be weak, you may have difficulty moving it or using it. You may not know what position it is in without looking at it while the block is in effect.  3. For blocks in the legs and feet, returning to weight bearing and walking needs to be done carefully. You will need to wait until the numbness is entirely gone and the strength has returned. You should be able to move your leg and foot normally before you try and bear weight or walk. You will need someone to be with you when you first try to ensure you do not fall and possibly risk injury.  4. Bruising and tenderness at the needle site are common side effects and will resolve in a few days.  5. Persistent numbness or new problems with movement should be communicated to the surgeon or the [facility] [phone] [place] [phone]).

## 2021-08-02 NOTE — Transfer of Care (Signed)
Immediate Anesthesia Transfer of Care Note  Patient: Charlotte [name]  Procedure(s) Performed: Left hallux metatarsophalangeal joint arthrodesis (Left: Foot) 2-3 Weil osteotomies; lateral collateral ligament repairs (Left: Foot)  Patient Location: PACU  Anesthesia Type:GA combined with regional for post-op pain  Level of Consciousness: drowsy and patient cooperative  Airway & Oxygen Therapy: Patient Spontanous Breathing and Patient connected to face mask oxygen  Post-op Assessment: Report given to RN and Post -op Vital signs reviewed and stable  Post vital signs: Reviewed and stable  Last Vitals:  Vitals Value Taken Time  BP    Temp    Pulse 88 08/02/21 1031  Resp    SpO2 96 % 08/02/21 1031  Vitals shown include unvalidated device data.  Last Pain:  Vitals:   08/02/21 0724  TempSrc: Oral  PainSc: 0-No pain      Patients Stated Pain Goal: 6 (75/44/92 0100)  Complications: No notable events documented.

## 2021-08-02 NOTE — Progress Notes (Signed)
Assisted Dr. Oren Bracket with left, ultrasound guided, popliteal block. Side rails up, monitors on throughout procedure. See vital signs in flow sheet. Tolerated Procedure well.

## 2021-08-03 ENCOUNTER — Other Ambulatory Visit (HOSPITAL_COMMUNITY): Payer: Self-pay

## 2021-08-06 ENCOUNTER — Encounter (HOSPITAL_BASED_OUTPATIENT_CLINIC_OR_DEPARTMENT_OTHER): Payer: Self-pay | Admitting: Orthopedic Surgery

## 2021-09-05 MED FILL — Losartan Potassium Tab 50 MG: ORAL | 30 days supply | Qty: 60 | Fill #3 | Status: AC

## 2021-09-06 ENCOUNTER — Other Ambulatory Visit (HOSPITAL_COMMUNITY): Payer: Self-pay

## 2021-09-12 DIAGNOSIS — M2022 Hallux rigidus, left foot: Secondary | ICD-10-CM | POA: Diagnosis not present

## 2021-09-12 DIAGNOSIS — Z4889 Encounter for other specified surgical aftercare: Secondary | ICD-10-CM | POA: Diagnosis not present

## 2021-09-17 ENCOUNTER — Other Ambulatory Visit (HOSPITAL_COMMUNITY): Payer: Self-pay

## 2021-09-18 ENCOUNTER — Other Ambulatory Visit (HOSPITAL_COMMUNITY): Payer: Self-pay

## 2021-09-25 DIAGNOSIS — M79672 Pain in left foot: Secondary | ICD-10-CM | POA: Diagnosis not present

## 2021-09-25 DIAGNOSIS — E7800 Pure hypercholesterolemia, unspecified: Secondary | ICD-10-CM | POA: Diagnosis not present

## 2021-09-25 DIAGNOSIS — I10 Essential (primary) hypertension: Secondary | ICD-10-CM | POA: Diagnosis not present

## 2021-09-25 DIAGNOSIS — F411 Generalized anxiety disorder: Secondary | ICD-10-CM | POA: Diagnosis not present

## 2021-09-25 DIAGNOSIS — M542 Cervicalgia: Secondary | ICD-10-CM | POA: Diagnosis not present

## 2021-09-25 DIAGNOSIS — G4700 Insomnia, unspecified: Secondary | ICD-10-CM | POA: Diagnosis not present

## 2021-09-26 ENCOUNTER — Other Ambulatory Visit (HOSPITAL_COMMUNITY): Payer: Self-pay

## 2021-09-26 MED ORDER — CITALOPRAM HYDROBROMIDE 20 MG PO TABS
ORAL_TABLET | ORAL | 3 refills | Status: DC
  Filled 2021-09-26: qty 90, 90d supply, fill #0

## 2021-09-26 MED ORDER — TELMISARTAN 80 MG PO TABS
ORAL_TABLET | ORAL | 3 refills | Status: DC
  Filled 2021-09-26: qty 90, 90d supply, fill #0

## 2021-09-26 MED ORDER — METHOCARBAMOL 500 MG PO TABS
ORAL_TABLET | ORAL | 0 refills | Status: DC
  Filled 2021-09-26: qty 30, 5d supply, fill #0

## 2021-09-27 ENCOUNTER — Other Ambulatory Visit: Payer: Self-pay | Admitting: Family Medicine

## 2021-09-27 DIAGNOSIS — E7800 Pure hypercholesterolemia, unspecified: Secondary | ICD-10-CM

## 2021-10-08 ENCOUNTER — Ambulatory Visit
Admission: RE | Admit: 2021-10-08 | Discharge: 2021-10-08 | Disposition: A | Discharge status: Home / Self Care | Payer: No Typology Code available for payment source | Source: Ambulatory Visit | Attending: Family Medicine | Admitting: Family Medicine

## 2021-10-08 DIAGNOSIS — E7800 Pure hypercholesterolemia, unspecified: Secondary | ICD-10-CM

## 2021-10-12 DIAGNOSIS — M79672 Pain in left foot: Secondary | ICD-10-CM | POA: Diagnosis not present

## 2021-10-12 DIAGNOSIS — Z4789 Encounter for other orthopedic aftercare: Secondary | ICD-10-CM | POA: Diagnosis not present

## 2021-10-25 ENCOUNTER — Other Ambulatory Visit: Payer: 59

## 2021-11-04 NOTE — Progress Notes (Signed)
?Cardiology Office Note:   ? ?Date:  11/05/2021  ? ?ID:  JAVAYA OREGON, DOB 04-06-1961, MRN [id number] ? ?PCP:  Antony Contras, MD  ?Cardiologist:  None  ? ?Referring MD: Antony Contras, MD  ? ?Chief Complaint  ?Patient presents with  ? Coronary Artery Disease  ? Hypertension  ? Hospitalization Follow-up  ?  Ascending aortic aneurysm  ? ? ?History of Present Illness:   ? ?Charlotte [name] is a 61 y.o. female with a hx of LDL, hypertension, dilated aorta, and elevated coronary calcium score. ? ? ?She is asymptomatic.  Has significant family history of coronary disease involving her father (CABG), a brother who has stents, and both paternal and maternal grandparents who have CAD. ? ?She has had atypical chest pain over the years.  Not precipitated by physical activity.  Prior stress test was negative. ? ?She has been on medication for blood pressure but discontinued it because of side effects.  She has been on statins in the past but discontinued them because of side effects.  She is concerned about being on beta-blocker therapy because it caused her brother to have memory loss. ? ?Past Medical History:  ?Diagnosis Date  ? Anxiety   ? Arthritis   ? Elevated LDL cholesterol level   ? Foot pain   ? GERD (gastroesophageal reflux disease)   ? Hyperlipidemia   ? Hypertension   ? Insomnia   ? Leukocytoclastic vasculitis ([place])   ? Neck pain   ? ? ?Past Surgical History:  ?Procedure Laterality Date  ? ANTERIOR CERVICAL DECOMP/DISCECTOMY FUSION N/A 07/06/2013  ? Procedure: ANTERIOR CERVICAL DECOMPRESSION/DISCECTOMY FUSION 2 LEVEL/HARDWARE REMOVAL;  Surgeon: Erline Levine, MD;  Location: [facility];  Service: Neurosurgery;  Laterality: N/A;  C3-4 Anterior cervical decompression/diskectomy/fusion/removal of hardware at C6 with revision of Anterior cervical fusion at C5-6  ? ARTHRODESIS METATARSALPHALANGEAL JOINT (MTPJ) Left 08/02/2021  ? Procedure: Left hallux metatarsophalangeal joint arthrodesis;  Surgeon: Wylene Simmer, MD;   Location: [facility];  Service: Orthopedics;  Laterality: Left;  ? BREAST SURGERY    ? right side  ? CARPAL TUNNEL RELEASE    ? bilateral  ? CERVICAL SPINE SURGERY    ? 2004  ? ENDOMETRIAL ABLATION    ? EYE SURGERY    ? FINGER SURGERY    ? left 5th finger  ? MOUTH SURGERY    ? 1970's  ? WEIL OSTEOTOMY Left 08/02/2021  ? Procedure: 2-3 Weil osteotomies; lateral collateral ligament repairs;  Surgeon: Wylene Simmer, MD;  Location: [facility];  Service: Orthopedics;  Laterality: Left;  ? ? ?Current Medications: ?Current Meds  ?Medication Sig  ? ALPRAZolam (XANAX) 0.5 MG tablet Take 1 tablet (0.5 mg total) by mouth 3 (three) times daily as needed.  ? Cholecalciferol (VITAMIN D) 50 MCG (2000 UT) tablet Take 1 tablet by mouth daily.  ? CINNAMON PO Take 1,200 mg by mouth daily.  ? docusate sodium (COLACE) 100 MG capsule Take 1 capsule (100 mg total) by mouth 2 (two) times daily. While taking narcotic pain medicine.  ? ibuprofen (ADVIL) 600 MG tablet Take 1 tablet (600 mg total) by mouth every 6 (six) hours as needed.  ? meloxicam (MOBIC) 15 MG tablet Take 1 tablet by mouth daily as needed  ? methocarbamol (ROBAXIN) 500 MG tablet Take 1 tablet (500 mg total) by mouth 4 (four) times daily.  ? metoprolol succinate (TOPROL XL) 25 MG 24 hr tablet Take 1/2 tablet (12.5 mg total) by mouth  daily.  ? Misc Natural Products (TURMERIC CURCUMIN) CAPS Take 1 capsule by mouth daily.  ? Nutritional Supplements (JUICE PLUS FIBRE PO) Take 1 capsule by mouth daily.  ? senna (SENOKOT) 8.6 MG TABS tablet Take 2 tablets (17.2 mg total) by mouth 2 (two) times daily.  ? telmisartan (MICARDIS) 80 MG tablet Take 1 tablet by mouth once a day  ? zolpidem (AMBIEN CR) 12.5 MG CR tablet Take 12.5 mg by mouth at bedtime as needed for sleep.  ? [DISCONTINUED] methocarbamol (ROBAXIN) 500 MG tablet Take 1 tablet by mouth every 4 hours as needed  ?  ? ?Allergies:   Hctz [hydrochlorothiazide], Lisinopril, Pregabalin, Statins, and  Telmisartan  ? ?Social History  ? ?Socioeconomic History  ? Marital status: Married  ?  Spouse name: Not on file  ? Number of children: Not on file  ? Years of education: Not on file  ? Highest education level: Not on file  ?Occupational History  ? Not on file  ?Tobacco Use  ? Smoking status: Never  ? Smokeless tobacco: Never  ?Substance and Sexual Activity  ? Alcohol use: Yes  ?  Comment: social  ? Drug use: No  ? Sexual activity: Not on file  ?Other Topics Concern  ? Not on file  ?Social History Narrative  ? Not on file  ? ?Social Determinants of Health  ? ?Financial Resource Strain: Not on file  ?Food Insecurity: Not on file  ?Transportation Needs: Not on file  ?Physical Activity: Not on file  ?Stress: Not on file  ?Social Connections: Not on file  ?  ? ?Family History: ?The patient's family history includes Atrial fibrillation in her mother; CAD in her brother; Cancer in her maternal grandmother; Diabetes in her father; Heart disease in her father; Hypertension in her father and mother; Hyperthyroidism in her mother; Hypothyroidism in her brother; Stroke in her mother. ? ?ROS:   ?Please see the history of present illness.    ?Family history is noted.  Orthopedic issues in the right foot with recent surgery.  All other systems reviewed and are negative. ? ?EKGs/Labs/Other Studies Reviewed:   ? ?The following studies were reviewed today: ?Coronary calcium score 10/08/2021: ?IMPRESSION: ?1. Coronary calcium score of 41 is at the 80th percentile for the ?patient's age, sex and race. ?2. Aneurysmal dilatation of the ascending thoracic aorta measuring ?up to 4.2 cm in estimated maximal diameter. Correlation with ?echocardiography may be helpful. Recommend annual imaging followup ?by CTA or MRA. This recommendation follows 2010 ? ?EKG:  EKG performed prior to surgery in December revealed complete right bundle with left anterior hemiblock. ? ?Recent Labs: ?No results found for requested labs within last 8760 hours.   ?Recent Lipid Panel ?No results found for: CHOL, TRIG, HDL, CHOLHDL, VLDL, LDLCALC, LDLDIRECT ? ?Physical Exam:   ? ?VS:  BP 132/88   Pulse 75   Ht 5' 8.5" (1.74 m)   Wt 208 lb 9.6 oz (94.6 kg)   SpO2 96%   BMI 31.26 kg/m?    ? ?Wt Readings from Last 3 Encounters:  ?11/05/21 208 lb 9.6 oz (94.6 kg)  ?08/02/21 215 lb 13.3 oz (97.9 kg)  ?02/11/21 203 lb (92.1 kg)  ?  ? ?GEN: Overweight. No acute distress ?HEENT: Normal ?NECK: No JVD. ?LYMPHATICS: No lymphadenopathy ?CARDIAC: No murmur. RRR no gallop, or edema. ?VASCULAR:  Normal Pulses. No bruits. ?RESPIRATORY:  Clear to auscultation without rales, wheezing or rhonchi  ?ABDOMEN: Soft, non-tender, non-distended, No pulsatile mass, ?MUSCULOSKELETAL: No deformity  ?  SKIN: Warm and dry ?NEUROLOGIC:  Alert and oriented x 3 ?PSYCHIATRIC:  Normal affect  ? ?ASSESSMENT:   ? ?1. Coronary artery disease involving native coronary artery of native heart without angina pectoris   ?2. Ascending aortic aneurysm, unspecified whether ruptured   ?3. Essential hypertension   ?4. Hyperlipidemia LDL goal <70   ?5. Nonspecific abnormal electrocardiogram (ECG) (EKG)   ? ?PLAN:   ? ?In order of problems listed above: ? ?Asymptomatic coronary disease and noted by coronary calcium score of 41.  Given family history, recommend starting statin therapy to achieve LDL less than 70.  She has had some intolerance in the past.  Initial attempt will be to start rosuvastatin 5 mg/day.  This will not be started until after we get blood pressure under control.  I hesitate to start multiple medications at once because she has had intolerance with most prior therapies.  He will be difficult to know what side effect is coming from which medication if we start multiple at 1 time. ?Start losartan 50 mg/day.  In 1 to 2 weeks, start metoprolol succinate 12.5 mg/day.  The Is to allow any side effects from losartan to appear.  Beta-blocker therapy will then need to be titrated to control blood pressure.  We  may need to add low-dose diuretic therapy. ?Same as for ascending aortic aneurysm.  Target blood pressure 130/80 mmHg.  We will start with beta-blocker and losartan.  HCTZ may need to be added.  2D Doppler echocar

## 2021-11-05 ENCOUNTER — Other Ambulatory Visit: Payer: Self-pay

## 2021-11-05 ENCOUNTER — Other Ambulatory Visit (HOSPITAL_COMMUNITY): Payer: Self-pay

## 2021-11-05 ENCOUNTER — Ambulatory Visit (INDEPENDENT_AMBULATORY_CARE_PROVIDER_SITE_OTHER): Payer: 59 | Admitting: Interventional Cardiology

## 2021-11-05 ENCOUNTER — Encounter: Payer: Self-pay | Admitting: Interventional Cardiology

## 2021-11-05 VITALS — BP 132/88 | HR 75 | Ht 68.5 in | Wt 208.6 lb

## 2021-11-05 DIAGNOSIS — I7121 Aneurysm of the ascending aorta, without rupture: Secondary | ICD-10-CM

## 2021-11-05 DIAGNOSIS — R9431 Abnormal electrocardiogram [ECG] [EKG]: Secondary | ICD-10-CM | POA: Diagnosis not present

## 2021-11-05 DIAGNOSIS — I2510 Atherosclerotic heart disease of native coronary artery without angina pectoris: Secondary | ICD-10-CM

## 2021-11-05 DIAGNOSIS — E785 Hyperlipidemia, unspecified: Secondary | ICD-10-CM | POA: Diagnosis not present

## 2021-11-05 DIAGNOSIS — I10 Essential (primary) hypertension: Secondary | ICD-10-CM

## 2021-11-05 MED ORDER — LOSARTAN POTASSIUM 50 MG PO TABS
50.0000 mg | ORAL_TABLET | Freq: Every day | ORAL | 3 refills | Status: DC
  Filled 2021-11-05: qty 90, 90d supply, fill #0
  Filled 2022-02-18: qty 90, 90d supply, fill #1

## 2021-11-05 MED ORDER — METOPROLOL SUCCINATE ER 25 MG PO TB24
12.5000 mg | ORAL_TABLET | Freq: Every day | ORAL | 3 refills | Status: DC
  Filled 2021-11-05: qty 45, 90d supply, fill #0

## 2021-11-05 NOTE — Patient Instructions (Addendum)
Medication Instructions:  ?1) START Losartan '50mg'$  once daily ?2) After being on Losartan for one week, START Metoprolol Succinate 12.'5mg'$  once daily ? ?*If you need a refill on your cardiac medications before your next appointment, please call your pharmacy* ? ? ?Lab Work: ?BMET and A1C in 1-2 weeks ? ?If you have labs (blood work) drawn today and your tests are completely normal, you will receive your results only by: ?MyChart Message (if you have MyChart) OR ?A paper copy in the mail ?If you have any lab test that is abnormal or we need to change your treatment, we will call you to review the results. ? ? ?Testing/Procedures: ?Your physician has requested that you have an echocardiogram. Echocardiography is a painless test that uses sound waves to create images of your heart. It provides your doctor with information about the size and shape of your heart and how well your heart?s chambers and valves are working. This procedure takes approximately one hour. There are no restrictions for this procedure. ? ? ? ?Follow-Up: ?At [facility], you and your health needs are our priority.  As part of our continuing mission to provide you with exceptional heart care, we have created designated Provider Care Teams.  These Care Teams include your primary Cardiologist (physician) and Advanced Practice Providers (APPs -  Physician Assistants and Nurse Practitioners) who all work together to provide you with the care you need, when you need it. ? ?We recommend signing up for the patient portal called "MyChart".  Sign up information is provided on this After Visit Summary.  MyChart is used to connect with patients for Virtual Visits (Telemedicine).  Patients are able to view lab/test results, encounter notes, upcoming appointments, etc.  Non-urgent messages can be sent to your provider as well.   ?To learn more about what you can do with MyChart, go to [email or web address].   ? ?Your next appointment:   ?4-6 week(s)- can use  4/19 at 2:40pm, 4/25 at 10:20am or 1:20pm OR 4/26 at 10:20am or 1:40pm ? ?The format for your next appointment:   ?In Person ? ?Provider:   ?Illene Labrador, III, MD  ? ? ?Other Instructions ?  ?

## 2021-11-06 ENCOUNTER — Encounter: Payer: Self-pay | Admitting: Interventional Cardiology

## 2021-11-12 ENCOUNTER — Other Ambulatory Visit: Payer: Self-pay

## 2021-11-12 ENCOUNTER — Other Ambulatory Visit: Payer: 59 | Admitting: *Deleted

## 2021-11-12 ENCOUNTER — Ambulatory Visit (HOSPITAL_COMMUNITY): Payer: 59 | Attending: Interventional Cardiology

## 2021-11-12 DIAGNOSIS — I10 Essential (primary) hypertension: Secondary | ICD-10-CM | POA: Diagnosis not present

## 2021-11-12 DIAGNOSIS — I2510 Atherosclerotic heart disease of native coronary artery without angina pectoris: Secondary | ICD-10-CM | POA: Diagnosis not present

## 2021-11-12 DIAGNOSIS — I7121 Aneurysm of the ascending aorta, without rupture: Secondary | ICD-10-CM | POA: Diagnosis not present

## 2021-11-12 LAB — ECHOCARDIOGRAM COMPLETE
Area-P 1/2: 3 cm2
S' Lateral: 2.4 cm

## 2021-11-13 LAB — BASIC METABOLIC PANEL
BUN/Creatinine Ratio: 14 (ref 12–28)
BUN: 13 mg/dL (ref 8–27)
CO2: 23 mmol/L (ref 20–29)
Calcium: 9.5 mg/dL (ref 8.7–10.3)
Chloride: 103 mmol/L (ref 96–106)
Creatinine, Ser: 0.94 mg/dL (ref 0.57–1.00)
Glucose: 92 mg/dL (ref 70–99)
Potassium: 4.4 mmol/L (ref 3.5–5.2)
Sodium: 140 mmol/L (ref 134–144)
eGFR: 69 mL/min/{1.73_m2} (ref >59)

## 2021-11-13 LAB — HEMOGLOBIN A1C
Est. average glucose Bld gHb Est-mCnc: 120 mg/dL
Hgb A1c MFr Bld: 5.8 % — ABNORMAL HIGH (ref 4.8–5.6)

## 2021-12-02 NOTE — Progress Notes (Signed)
?Cardiology Office Note:   ? ?Date:  12/05/2021  ? ?ID:  Charlotte [name], DOB 1961/05/23, MRN [id number] ? ?PCP:  Antony Contras, MD  ?Cardiologist:  None  ? ?Referring MD: Antony Contras, MD  ? ?Chief Complaint  ?Patient presents with  ? Coronary Artery Disease  ? Hypertension  ? ? ?History of Present Illness:   ? ?Charlotte [name] is a 61 y.o. female with a hx of LDL, primary hypertension, dilated aorta 42 cm 10/2021, LVH with preserved EF, and elevated coronary calcium score. ? ?Feels okay.  Since I last discussion she has decided to retire.  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 ? ?We discussed the findings of the cardiac work-up.  She has a coronary calcium score of 41.  This also identified aortic diameter of 42 mm. ? ?Echocardiography identified upper limit of normal to mildly dilated aorta at 39 mm.  She has mild left ventricular hypertrophy with preserved systolic function.  The atrial sizes are normal. ? ?Past Medical History:  ?Diagnosis Date  ? Anxiety   ? Arthritis   ? Elevated LDL cholesterol level   ? Foot pain   ? GERD (gastroesophageal reflux disease)   ? Hyperlipidemia   ? Hypertension   ? Insomnia   ? Leukocytoclastic vasculitis ([place])   ? Neck pain   ? ? ?Past Surgical History:  ?Procedure Laterality Date  ? ANTERIOR CERVICAL DECOMP/DISCECTOMY FUSION N/A 07/06/2013  ? Procedure: ANTERIOR CERVICAL DECOMPRESSION/DISCECTOMY FUSION 2 LEVEL/HARDWARE REMOVAL;  Surgeon: Erline Levine, MD;  Location: [place] [facility];  Service: Neurosurgery;  Laterality: N/A;  C3-4 Anterior cervical decompression/diskectomy/fusion/removal of hardware at C6 with revision of Anterior cervical fusion at C5-6  ? ARTHRODESIS METATARSALPHALANGEAL JOINT (MTPJ) Left 08/02/2021  ? Procedure: Left hallux metatarsophalangeal joint arthrodesis;  Surgeon: Wylene Simmer, MD;  Location: [place];  Service: Orthopedics;  Laterality: Left;  ? BREAST SURGERY    ? right side  ? CARPAL TUNNEL  RELEASE    ? bilateral  ? CERVICAL SPINE SURGERY    ? 2004  ? ENDOMETRIAL ABLATION    ? EYE SURGERY    ? FINGER SURGERY    ? left 5th finger  ? MOUTH SURGERY    ? 1970's  ? WEIL OSTEOTOMY Left 08/02/2021  ? Procedure: 2-3 Weil osteotomies; lateral collateral ligament repairs;  Surgeon: Wylene Simmer, MD;  Location: [place];  Service: Orthopedics;  Laterality: Left;  ? ? ?Current Medications: ?Current Meds  ?Medication Sig  ? ALPRAZolam (XANAX) 0.5 MG tablet Take 1 tablet (0.5 mg total) by mouth 3 (three) times daily as needed.  ? chlorthalidone (HYGROTON) 25 MG tablet Take 0.5 tablets (12.5 mg total) by mouth daily.  ? Cholecalciferol (VITAMIN D) 50 MCG (2000 UT) tablet Take 1 tablet by mouth daily.  ? CINNAMON PO Take 1,200 mg by mouth daily.  ? citalopram (CELEXA) 20 MG tablet TAKE 1 TABLET BY MOUTH ONCE A DAY  ? docusate sodium (COLACE) 100 MG capsule Take 1 capsule (100 mg total) by mouth 2 (two) times daily. While taking narcotic pain medicine.  ? estradiol (VIVELLE-DOT) 0.1 MG/24HR patch APPLY 1 PATCH ON THE SKIN TWICE WEEKLY  ? ibuprofen (ADVIL) 600 MG tablet Take 1 tablet (600 mg total) by mouth every 6 (six) hours as needed.  ? losartan (COZAAR) 50 MG tablet Take 1 tablet (50 mg total) by mouth daily.  ? meloxicam (MOBIC) 15 MG tablet Take 1 tablet by  mouth daily as needed  ? methocarbamol (ROBAXIN) 500 MG tablet Take 1 tablet (500 mg total) by mouth 4 (four) times daily.  ? metoprolol succinate (TOPROL XL) 25 MG 24 hr tablet Take 1 tablet (25 mg total) by mouth daily.  ? Misc Natural Products (TURMERIC CURCUMIN) CAPS Take 1 capsule by mouth daily.  ? progesterone (PROMETRIUM) 200 MG capsule TAKE 1 CAPSULE BY MOUTH AT BEDTIME  ? rosuvastatin (CRESTOR) 10 MG tablet Take 1 tablet (10 mg total) by mouth daily.  ? senna (SENOKOT) 8.6 MG TABS tablet Take 2 tablets (17.2 mg total) by mouth 2 (two) times daily.  ? zolpidem (AMBIEN CR) 12.5 MG CR tablet Take 12.5 mg by mouth at bedtime as needed  for sleep.  ? [DISCONTINUED] chlorthalidone (HYGROTON) 25 MG tablet Take 1 tablet (25 mg total) by mouth daily.  ? [DISCONTINUED] metoprolol succinate (TOPROL XL) 25 MG 24 hr tablet Take 1/2 tablet (12.5 mg total) by mouth daily.  ?  ? ?Allergies:   Hctz [hydrochlorothiazide], Lisinopril, Pregabalin, Statins, and Telmisartan  ? ?Social History  ? ?Socioeconomic History  ? Marital status: Married  ?  Spouse name: Not on file  ? Number of children: Not on file  ? Years of education: Not on file  ? Highest education level: Not on file  ?Occupational History  ? Not on file  ?Tobacco Use  ? Smoking status: Never  ? Smokeless tobacco: Never  ?Substance and Sexual Activity  ? Alcohol use: Yes  ?  Comment: social  ? Drug use: No  ? Sexual activity: Not on file  ?Other Topics Concern  ? Not on file  ?Social History Narrative  ? Not on file  ? ?Social Determinants of Health  ? ?Financial Resource Strain: Not on file  ?Food Insecurity: Not on file  ?Transportation Needs: Not on file  ?Physical Activity: Not on file  ?Stress: Not on file  ?Social Connections: Not on file  ?  ? ?Family History: ?The patient's family history includes Atrial fibrillation in her mother; CAD in her brother; Cancer in her maternal grandmother; Diabetes in her father; Heart disease in her father; Hypertension in her father and mother; Hyperthyroidism in her mother; Hypothyroidism in her brother; Stroke in her mother. ? ?ROS:   ?Please see the history of present illness.    ?She does drink alcohol in the evenings.  She snores.  May have sleep apnea.  Does not want to be evaluated.  All other systems reviewed and are negative. ? ?EKGs/Labs/Other Studies Reviewed:   ? ?The following studies were reviewed today: ?Cor Calcium Score 10/08/2021: ?IMPRESSION: ?1. Coronary calcium score of 41 is at the 80th percentile for the ?patient's age, sex and race. ?2. Aneurysmal dilatation of the ascending thoracic aorta measuring ?up to 4.2 cm in estimated maximal  diameter. Correlation with ?echocardiography may be helpful. Recommend annual imaging followup ? ?2 D DOPPLER ECHOCARDIOGRAM 11/12/2021: ?IMPRESSIONS  ? 1. Left ventricular ejection fraction, by estimation, is 60 to 65%. The  ?left ventricle has normal function. The left ventricle has no regional  ?wall motion abnormalities. There is mild concentric left ventricular  ?hypertrophy. Left ventricular diastolic  ?parameters were normal.  ? 2. Right ventricular systolic function is normal. The right ventricular  ?size is normal. Tricuspid regurgitation signal is inadequate for assessing  ?PA pressure.  ? 3. The mitral valve is normal in structure. Trivial mitral valve  ?regurgitation.  ? 4. The aortic valve is tricuspid. There is mild thickening of  the aortic  ?valve. Aortic valve regurgitation is not visualized. Aortic valve  ?sclerosis is present, with no evidence of aortic valve stenosis.  ? 5. Aortic dilatation noted. There is mild dilatation of the ascending  ?aorta, measuring 38 mm.  ? 6. The inferior vena cava is normal in size with greater than 50%  ?respiratory variability, suggesting right atrial pressure of 3 mmHg.  ? ?Comparison(s): No prior Echocardiogram.  ? ? ?EKG:  EKG not repeated ? ?Recent Labs: ?11/12/2021: BUN 13; Creatinine, Ser 0.94; Potassium 4.4; Sodium 140  ?Recent Lipid Panel ?No results found for: CHOL, TRIG, HDL, CHOLHDL, VLDL, LDLCALC, LDLDIRECT ? ?Physical Exam:   ? ?VS:  BP 128/70   Pulse 68   Ht 5' 8.5" (1.74 m)   Wt 208 lb 9.6 oz (94.6 kg)   SpO2 96%   BMI 31.26 kg/m?    ? ?Wt Readings from Last 3 Encounters:  ?12/05/21 208 lb 9.6 oz (94.6 kg)  ?11/05/21 208 lb 9.6 oz (94.6 kg)  ?08/02/21 215 lb 13.3 oz (97.9 kg)  ?  ? ?GEN: Unchanged. No acute distress ?HEENT: Normal ?NECK: No JVD. ?LYMPHATICS: No lymphadenopathy ?CARDIAC: No murmur. RRR no gallop, or edema. ?VASCULAR:  Normal Pulses. No bruits. ?RESPIRATORY:  Clear to auscultation without rales, wheezing or rhonchi  ?ABDOMEN: Soft,  non-tender, non-distended, No pulsatile mass, ?MUSCULOSKELETAL: No deformity  ?SKIN: Warm and dry ?NEUROLOGIC:  Alert and oriented x 3 ?PSYCHIATRIC:  Normal affect  ? ?ASSESSMENT:   ? ?1. Coronary artery disease involv

## 2021-12-05 ENCOUNTER — Encounter: Payer: Self-pay | Admitting: Interventional Cardiology

## 2021-12-05 ENCOUNTER — Other Ambulatory Visit (HOSPITAL_COMMUNITY): Payer: Self-pay

## 2021-12-05 ENCOUNTER — Ambulatory Visit (INDEPENDENT_AMBULATORY_CARE_PROVIDER_SITE_OTHER): Payer: 59 | Admitting: Interventional Cardiology

## 2021-12-05 VITALS — BP 128/70 | HR 68 | Ht 68.5 in | Wt 208.6 lb

## 2021-12-05 DIAGNOSIS — I10 Essential (primary) hypertension: Secondary | ICD-10-CM

## 2021-12-05 DIAGNOSIS — E785 Hyperlipidemia, unspecified: Secondary | ICD-10-CM

## 2021-12-05 DIAGNOSIS — I517 Cardiomegaly: Secondary | ICD-10-CM | POA: Diagnosis not present

## 2021-12-05 DIAGNOSIS — R0683 Snoring: Secondary | ICD-10-CM | POA: Diagnosis not present

## 2021-12-05 DIAGNOSIS — I2510 Atherosclerotic heart disease of native coronary artery without angina pectoris: Secondary | ICD-10-CM

## 2021-12-05 DIAGNOSIS — R9431 Abnormal electrocardiogram [ECG] [EKG]: Secondary | ICD-10-CM

## 2021-12-05 DIAGNOSIS — I7121 Aneurysm of the ascending aorta, without rupture: Secondary | ICD-10-CM

## 2021-12-05 MED ORDER — CHLORTHALIDONE 25 MG PO TABS
12.5000 mg | ORAL_TABLET | Freq: Every day | ORAL | 3 refills | Status: DC
  Filled 2021-12-05: qty 45, 90d supply, fill #0
  Filled 2022-02-18: qty 45, 90d supply, fill #1
  Filled 2022-06-10: qty 45, 90d supply, fill #2
  Filled 2022-10-11 (×2): qty 45, 90d supply, fill #3

## 2021-12-05 MED ORDER — ROSUVASTATIN CALCIUM 10 MG PO TABS
10.0000 mg | ORAL_TABLET | Freq: Every day | ORAL | 3 refills | Status: DC
  Filled 2021-12-05: qty 90, 90d supply, fill #0

## 2021-12-05 MED ORDER — CHLORTHALIDONE 25 MG PO TABS
25.0000 mg | ORAL_TABLET | Freq: Every day | ORAL | 3 refills | Status: DC
  Filled 2021-12-05: qty 90, 90d supply, fill #0

## 2021-12-05 MED ORDER — METOPROLOL SUCCINATE ER 25 MG PO TB24
25.0000 mg | ORAL_TABLET | Freq: Every day | ORAL | 3 refills | Status: DC
  Filled 2021-12-05: qty 90, 90d supply, fill #0

## 2021-12-05 NOTE — Patient Instructions (Signed)
Medication Instructions:  ?1) START Rosuvastatin '10mg'$  once daily ?2) START Chlorthalidone 12.'5mg'$  every morning ?3) INCREASE Metoprolol Succinate '25mg'$  once daily ? ?*If you need a refill on your cardiac medications before your next appointment, please call your pharmacy* ? ? ?Lab Work: ?Lipid, Liver and BMET in 1 month.  You will need to be fasting for these labs (nothing to eat or drink after midnight except water and black coffee). ? ?If you have labs (blood work) drawn today and your tests are completely normal, you will receive your results only by: ?MyChart Message (if you have MyChart) OR ?A paper copy in the mail ?If you have any lab test that is abnormal or we need to change your treatment, we will call you to review the results. ? ? ?Testing/Procedures: ?None ? ? ?Follow-Up: ?At [facility], you and your health needs are our priority.  As part of our continuing mission to provide you with exceptional heart care, we have created designated Provider Care Teams.  These Care Teams include your primary Cardiologist (physician) and Advanced Practice Providers (APPs -  Physician Assistants and Nurse Practitioners) who all work together to provide you with the care you need, when you need it. ? ?We recommend signing up for the patient portal called "MyChart".  Sign up information is provided on this After Visit Summary.  MyChart is used to connect with patients for Virtual Visits (Telemedicine).  Patients are able to view lab/test results, encounter notes, upcoming appointments, etc.  Non-urgent messages can be sent to your provider as well.   ?To learn more about what you can do with MyChart, go to [email or web address].   ? ?Your next appointment:   ?6 month(s) ? ?The format for your next appointment:   ?In Person ? ?Provider:   ?[facility]. Blenda Bridegroom, MD  ? ? ?Other Instructions ? ? ?Important Information About Sugar ? ? ? ? ?  ?

## 2021-12-06 ENCOUNTER — Other Ambulatory Visit (HOSPITAL_COMMUNITY): Payer: Self-pay

## 2021-12-12 ENCOUNTER — Other Ambulatory Visit (HOSPITAL_COMMUNITY): Payer: Self-pay

## 2021-12-20 ENCOUNTER — Other Ambulatory Visit (HOSPITAL_COMMUNITY): Payer: Self-pay

## 2021-12-20 MED ORDER — ALPRAZOLAM 0.5 MG PO TABS
ORAL_TABLET | ORAL | 0 refills | Status: DC
  Filled 2021-12-20: qty 30, 30d supply, fill #0

## 2021-12-20 MED ORDER — ESTRADIOL 0.1 MG/24HR TD PTTW
MEDICATED_PATCH | TRANSDERMAL | 1 refills | Status: DC
  Filled 2021-12-20: qty 24, 84d supply, fill #0
  Filled 2022-03-15: qty 24, 84d supply, fill #1

## 2021-12-21 ENCOUNTER — Other Ambulatory Visit (HOSPITAL_COMMUNITY): Payer: Self-pay

## 2021-12-31 ENCOUNTER — Encounter: Payer: Self-pay | Admitting: Interventional Cardiology

## 2022-01-08 ENCOUNTER — Other Ambulatory Visit: Payer: BC Managed Care – PPO | Admitting: *Deleted

## 2022-01-08 DIAGNOSIS — I10 Essential (primary) hypertension: Secondary | ICD-10-CM

## 2022-01-08 DIAGNOSIS — E785 Hyperlipidemia, unspecified: Secondary | ICD-10-CM

## 2022-01-08 DIAGNOSIS — I2510 Atherosclerotic heart disease of native coronary artery without angina pectoris: Secondary | ICD-10-CM

## 2022-01-08 LAB — LIPID PANEL
Chol/HDL Ratio: 3.2 ratio (ref 0.0–4.4)
Cholesterol, Total: 170 mg/dL (ref 100–199)
HDL: 53 mg/dL (ref >39)
LDL Chol Calc (NIH): 98 mg/dL (ref 0–99)
Triglycerides: 106 mg/dL (ref 0–149)
VLDL Cholesterol Cal: 19 mg/dL (ref 5–40)

## 2022-01-08 LAB — HEPATIC FUNCTION PANEL
ALT: 53 IU/L — ABNORMAL HIGH (ref 0–32)
AST: 36 IU/L (ref 0–40)
Albumin: 4.3 g/dL (ref 3.8–4.8)
Alkaline Phosphatase: 115 IU/L (ref 44–121)
Bilirubin Total: 0.5 mg/dL (ref 0.0–1.2)
Bilirubin, Direct: 0.15 mg/dL (ref 0.00–0.40)
Total Protein: 7.3 g/dL (ref 6.0–8.5)

## 2022-01-08 LAB — BASIC METABOLIC PANEL
BUN/Creatinine Ratio: 17 (ref 12–28)
BUN: 16 mg/dL (ref 8–27)
CO2: 27 mmol/L (ref 20–29)
Calcium: 9.3 mg/dL (ref 8.7–10.3)
Chloride: 96 mmol/L (ref 96–106)
Creatinine, Ser: 0.92 mg/dL (ref 0.57–1.00)
Glucose: 114 mg/dL — ABNORMAL HIGH (ref 70–99)
Potassium: 3.8 mmol/L (ref 3.5–5.2)
Sodium: 139 mmol/L (ref 134–144)
eGFR: 71 mL/min/{1.73_m2} (ref >59)

## 2022-01-10 ENCOUNTER — Telehealth: Payer: Self-pay

## 2022-01-10 ENCOUNTER — Other Ambulatory Visit (HOSPITAL_COMMUNITY): Payer: Self-pay

## 2022-01-10 DIAGNOSIS — E785 Hyperlipidemia, unspecified: Secondary | ICD-10-CM

## 2022-01-10 MED ORDER — ROSUVASTATIN CALCIUM 20 MG PO TABS
20.0000 mg | ORAL_TABLET | Freq: Every day | ORAL | 3 refills | Status: DC
  Filled 2022-01-10: qty 90, 90d supply, fill #0

## 2022-01-10 NOTE — Telephone Encounter (Signed)
Spoke with patient and discussed lab results.  Per Dr. Tamala Julian: Let the patient know the LDL is better but still above target. Goal is LDL < 70. Increase Rosuvastatin to 20 mg daily. Liver and lipid panel 2 months.  Rosuvastatin '20mg'$  QD sent to pharmacy of choice.  CMET and Lipid panel ordered and scheduled for 03/13/22.  Patient verbalized understanding and agrees with plan above.

## 2022-01-10 NOTE — Telephone Encounter (Signed)
-----   Message from Belva Crome, MD sent at 01/09/2022  8:57 PM EDT ----- Let the patient know the LDL is better but still above target. Goal is LDL < 70. Increase Rosuvastatin to 20 mg daily. Liver and lipid panel 2 months. A copy will be sent to Stacie Glaze, DO

## 2022-01-12 ENCOUNTER — Other Ambulatory Visit (HOSPITAL_COMMUNITY): Payer: Self-pay

## 2022-01-15 ENCOUNTER — Other Ambulatory Visit (HOSPITAL_COMMUNITY): Payer: Self-pay

## 2022-01-15 MED ORDER — AMOXICILLIN-POT CLAVULANATE 875-125 MG PO TABS
ORAL_TABLET | ORAL | 0 refills | Status: DC
  Filled 2022-01-15: qty 14, 7d supply, fill #0

## 2022-01-15 MED ORDER — BENZONATATE 100 MG PO CAPS
ORAL_CAPSULE | ORAL | 0 refills | Status: DC
  Filled 2022-01-15: qty 30, 7d supply, fill #0

## 2022-01-15 MED ORDER — CITALOPRAM HYDROBROMIDE 20 MG PO TABS
ORAL_TABLET | ORAL | 2 refills | Status: DC
  Filled 2022-01-15: qty 90, 90d supply, fill #0
  Filled 2022-04-11: qty 90, 90d supply, fill #1
  Filled 2022-07-12: qty 90, 90d supply, fill #2

## 2022-01-15 MED ORDER — PREDNISONE 20 MG PO TABS
ORAL_TABLET | ORAL | 0 refills | Status: DC
  Filled 2022-01-15: qty 10, 5d supply, fill #0

## 2022-01-30 ENCOUNTER — Other Ambulatory Visit (HOSPITAL_COMMUNITY): Payer: Self-pay

## 2022-01-31 ENCOUNTER — Other Ambulatory Visit (HOSPITAL_COMMUNITY): Payer: Self-pay

## 2022-01-31 MED ORDER — PROGESTERONE 200 MG PO CAPS
ORAL_CAPSULE | ORAL | 0 refills | Status: DC
  Filled 2022-01-31: qty 90, 90d supply, fill #0

## 2022-02-18 ENCOUNTER — Other Ambulatory Visit (HOSPITAL_COMMUNITY): Payer: Self-pay

## 2022-02-26 ENCOUNTER — Encounter: Payer: Self-pay | Admitting: Interventional Cardiology

## 2022-03-13 ENCOUNTER — Other Ambulatory Visit: Payer: BC Managed Care – PPO

## 2022-03-13 DIAGNOSIS — E785 Hyperlipidemia, unspecified: Secondary | ICD-10-CM

## 2022-03-13 LAB — COMPREHENSIVE METABOLIC PANEL
ALT: 20 IU/L (ref 0–32)
AST: 26 IU/L (ref 0–40)
Albumin/Globulin Ratio: 1.8 (ref 1.2–2.2)
Albumin: 4.9 g/dL (ref 3.9–4.9)
Alkaline Phosphatase: 70 IU/L (ref 44–121)
BUN/Creatinine Ratio: 17 (ref 12–28)
BUN: 17 mg/dL (ref 8–27)
Bilirubin Total: 1 mg/dL (ref 0.0–1.2)
CO2: 24 mmol/L (ref 20–29)
Calcium: 9.7 mg/dL (ref 8.7–10.3)
Chloride: 100 mmol/L (ref 96–106)
Creatinine, Ser: 0.98 mg/dL (ref 0.57–1.00)
Globulin, Total: 2.7 g/dL (ref 1.5–4.5)
Glucose: 92 mg/dL (ref 70–99)
Potassium: 4.2 mmol/L (ref 3.5–5.2)
Sodium: 140 mmol/L (ref 134–144)
Total Protein: 7.6 g/dL (ref 6.0–8.5)
eGFR: 65 mL/min/{1.73_m2} (ref >59)

## 2022-03-13 LAB — LIPID PANEL
Chol/HDL Ratio: 3 ratio (ref 0.0–4.4)
Cholesterol, Total: 172 mg/dL (ref 100–199)
HDL: 58 mg/dL (ref >39)
LDL Chol Calc (NIH): 94 mg/dL (ref 0–99)
Triglycerides: 115 mg/dL (ref 0–149)
VLDL Cholesterol Cal: 20 mg/dL (ref 5–40)

## 2022-03-15 ENCOUNTER — Other Ambulatory Visit (HOSPITAL_COMMUNITY): Payer: Self-pay

## 2022-03-22 ENCOUNTER — Other Ambulatory Visit (HOSPITAL_COMMUNITY): Payer: Self-pay

## 2022-03-22 MED ORDER — ZOLPIDEM TARTRATE ER 12.5 MG PO TBCR
EXTENDED_RELEASE_TABLET | ORAL | 1 refills | Status: DC
  Filled 2022-03-22: qty 30, 30d supply, fill #0
  Filled 2022-07-12: qty 30, 30d supply, fill #1

## 2022-03-22 MED ORDER — ALPRAZOLAM 0.5 MG PO TABS
ORAL_TABLET | ORAL | 5 refills | Status: DC
  Filled 2022-03-22: qty 30, 30d supply, fill #0
  Filled 2022-05-23: qty 30, 30d supply, fill #1
  Filled 2022-07-12: qty 30, 30d supply, fill #2
  Filled 2022-08-18 – 2022-08-27 (×2): qty 30, 30d supply, fill #3

## 2022-03-22 MED ORDER — PANTOPRAZOLE SODIUM 40 MG PO TBEC
DELAYED_RELEASE_TABLET | ORAL | 0 refills | Status: DC
  Filled 2022-03-22: qty 90, 90d supply, fill #0

## 2022-03-26 ENCOUNTER — Other Ambulatory Visit (HOSPITAL_COMMUNITY): Payer: Self-pay

## 2022-03-26 MED ORDER — ESTRADIOL 0.1 MG/24HR TD PTTW
MEDICATED_PATCH | TRANSDERMAL | 3 refills | Status: DC
  Filled 2022-03-26 – 2022-06-10 (×2): qty 24, 84d supply, fill #0
  Filled 2022-11-28: qty 24, 84d supply, fill #1
  Filled 2023-02-17: qty 24, 84d supply, fill #2

## 2022-03-26 MED ORDER — PROGESTERONE 200 MG PO CAPS: ORAL_CAPSULE | ORAL | 3 refills | Status: DC

## 2022-04-11 ENCOUNTER — Other Ambulatory Visit (HOSPITAL_COMMUNITY): Payer: Self-pay

## 2022-05-23 ENCOUNTER — Other Ambulatory Visit (HOSPITAL_COMMUNITY): Payer: Self-pay

## 2022-05-25 ENCOUNTER — Other Ambulatory Visit (HOSPITAL_COMMUNITY): Payer: Self-pay

## 2022-06-02 NOTE — Progress Notes (Signed)
Cardiology Office Note:    Date:  06/04/2022   ID:  Charlotte [name], DOB February 05, 1961, MRN [id number]  PCP:  Antony Contras, MD  Cardiologist:  Sinclair Grooms, MD   Referring MD: Antony Contras, MD   Chief Complaint  Patient presents with   Hypertension   Coronary Artery Disease   Thoracic Aortic Aneurysm    History of Present Illness:    Charlotte [name] is a 61 y.o. female with a hx of of LDL, primary hypertension, dilated aorta 42 cm 10/2021, LVH with preserved EF, and elevated coronary calcium score (41).  She is asymptomatic.   We attempted to use beta-blocker and ARB therapy since her aorta is dilated at.  Those medications were being used to control blood pressure.  With a send ARB therapy she gets cough.  Beta-blocker therapy caused her to have excessive fatigue.  She has relatively low risk coronary calcium.  We tried rosuvastatin 20 mg/day, but it caused unacceptable musculoskeletal side effects.  She stopped the medication.  The only current medication is chlorthalidone 12.5 mg daily without side effects.   Past Medical History:  Diagnosis Date   Anxiety    Arthritis    Elevated LDL cholesterol level    Foot pain    GERD (gastroesophageal reflux disease)    Hyperlipidemia    Hypertension    Insomnia    Leukocytoclastic vasculitis ([facility])    Neck pain     Past Surgical History:  Procedure Laterality Date   ANTERIOR CERVICAL DECOMP/DISCECTOMY FUSION N/A 07/06/2013   Procedure: ANTERIOR CERVICAL DECOMPRESSION/DISCECTOMY FUSION 2 LEVEL/HARDWARE REMOVAL;  Surgeon: Erline Levine, MD;  Location: [place] [facility];  Service: Neurosurgery;  Laterality: N/A;  C3-4 Anterior cervical decompression/diskectomy/fusion/removal of hardware at C6 with revision of Anterior cervical fusion at C5-6   ARTHRODESIS METATARSALPHALANGEAL JOINT (MTPJ) Left 08/02/2021   Procedure: Left hallux metatarsophalangeal joint arthrodesis;  Surgeon: Wylene Simmer, MD;  Location: [place];  Service: Orthopedics;  Laterality: Left;   BREAST SURGERY     right side   CARPAL TUNNEL RELEASE     bilateral   CERVICAL SPINE SURGERY     2004   ENDOMETRIAL ABLATION     EYE SURGERY     FINGER SURGERY     left 5th finger   MOUTH SURGERY     1970's   WEIL OSTEOTOMY Left 08/02/2021   Procedure: 2-3 Weil osteotomies; lateral collateral ligament repairs;  Surgeon: Wylene Simmer, MD;  Location: [place];  Service: Orthopedics;  Laterality: Left;    Current Medications: Current Meds  Medication Sig   ALPRAZolam (XANAX) 0.5 MG tablet Take 1 tablet by mouth at bedtime as needed   amLODipine (NORVASC) 5 MG tablet Take 1 tablet (5 mg total) by mouth daily.   amoxicillin-clavulanate (AUGMENTIN) 875-125 MG tablet Take 1 tablet by mouth every 12 hours for 7 days.   chlorthalidone (HYGROTON) 25 MG tablet Take 1/2 tablet (12.5 mg total) by mouth daily.   Cholecalciferol (VITAMIN D) 50 MCG (2000 UT) tablet Take 1 tablet by mouth daily.   CINNAMON PO Take 1,200 mg by mouth daily.   docusate sodium (COLACE) 100 MG capsule Take 1 capsule (100 mg total) by mouth 2 (two) times daily. While taking narcotic pain medicine.   estradiol (DOTTI) 0.1 MG/24HR patch APPLY 1 PATCH ON THE SKIN TWICE WEEKLY   ezetimibe (ZETIA) 10 MG tablet Take 1 tablet (10 mg total) by mouth daily.   ibuprofen (ADVIL)  600 MG tablet Take 1 tablet (600 mg total) by mouth every 6 (six) hours as needed.   losartan (COZAAR) 50 MG tablet Take 1 tablet (50 mg total) by mouth daily.   meloxicam (MOBIC) 15 MG tablet Take 1 tablet by mouth daily as needed   methocarbamol (ROBAXIN) 500 MG tablet Take 1 tablet (500 mg total) by mouth 4 (four) times daily. (Patient taking differently: Take 500 mg by mouth as needed.)   Misc Natural Products (TURMERIC CURCUMIN) CAPS Take 1 capsule by mouth daily.   pantoprazole (PROTONIX) 20 MG tablet Take 20 mg by mouth daily.   [START ON 06/05/2022] pravastatin (PRAVACHOL) 10 MG  tablet Take 1 tablet (10 mg total) by mouth 3 (three) times a week. Take on Mondays, Wednesdays, and Fridays.   predniSONE (DELTASONE) 20 MG tablet Take 2 tablets with food or milk by mouth once a day for 5 days.   senna (SENOKOT) 8.6 MG TABS tablet Take 2 tablets (17.2 mg total) by mouth 2 (two) times daily.   zolpidem (AMBIEN CR) 12.5 MG CR tablet Take 1 tablet by mouth at bedtime as needed   [DISCONTINUED] rosuvastatin (CRESTOR) 20 MG tablet Take 1 tablet (20 mg total) by mouth daily.     Allergies:   Hctz [hydrochlorothiazide], Lisinopril, Pregabalin, Statins, and Telmisartan   Social History   Socioeconomic History   Marital status: Married    Spouse name: Not on file   Number of children: Not on file   Years of education: Not on file   Highest education level: Not on file  Occupational History   Not on file  Tobacco Use   Smoking status: Never   Smokeless tobacco: Never  Substance and Sexual Activity   Alcohol use: Yes    Comment: social   Drug use: No   Sexual activity: Not on file  Other Topics Concern   Not on file  Social History Narrative   Not on file   Social Determinants of Health   Financial Resource Strain: Not on file  Food Insecurity: Not on file  Transportation Needs: Not on file  Physical Activity: Not on file  Stress: Not on file  Social Connections: Not on file     Family History: The patient's family history includes Atrial fibrillation in her mother; CAD in her brother; Cancer in her maternal grandmother; Diabetes in her father; Heart disease in her father; Hypertension in her father and mother; Hyperthyroidism in her mother; Hypothyroidism in her brother; Stroke in her mother.  ROS:   Please see the history of present illness.    No new complaints other than reflux.  She feels that some of the medications to be used because the reflux to flare.  All other systems reviewed and are negative.  EKGs/Labs/Other Studies Reviewed:    The following  studies were reviewed today:  Cor Calcium Score 10/08/2021: IMPRESSION: 1. Coronary calcium score of 41 is at the 80th percentile for the patient's age, sex and race. 2. Aneurysmal dilatation of the ascending thoracic aorta measuring up to 4.2 cm in estimated maximal diameter. Correlation with echocardiography may be helpful. Recommend annual imaging followup   2 D DOPPLER ECHOCARDIOGRAM 11/12/2021: IMPRESSIONS   1. Left ventricular ejection fraction, by estimation, is 60 to 65%. The  left ventricle has normal function. The left ventricle has no regional  wall motion abnormalities. There is mild concentric left ventricular  hypertrophy. Left ventricular diastolic  parameters were normal.   2. Right ventricular systolic function  is normal. The right ventricular  size is normal. Tricuspid regurgitation signal is inadequate for assessing  PA pressure.   3. The mitral valve is normal in structure. Trivial mitral valve  regurgitation.   4. The aortic valve is tricuspid. There is mild thickening of the aortic  valve. Aortic valve regurgitation is not visualized. Aortic valve  sclerosis is present, with no evidence of aortic valve stenosis.   5. Aortic dilatation noted. There is mild dilatation of the ascending  aorta, measuring 38 mm.   6. The inferior vena cava is normal in size with greater than 50%  respiratory variability, suggesting right atrial pressure of 3 mmHg.   Comparison(s): No prior Echocardiogram.   EKG:  EKG normal sinus rhythm, left anterior hemiblock, right bundle branch block.  Left atrial abnormality.  Compared to July 27, 2021, incomplete right bundle branch block in 2022 has progressed to right bundle branch block.  Recent Labs: 03/13/2022: ALT 20; BUN 17; Creatinine, Ser 0.98; Potassium 4.2; Sodium 140  Recent Lipid Panel    Component Value Date/Time   CHOL 172 03/13/2022 0840   TRIG 115 03/13/2022 0840   HDL 58 03/13/2022 0840   CHOLHDL 3.0 03/13/2022 0840    LDLCALC 94 03/13/2022 0840    Physical Exam:    VS:  BP (!) 160/94   Pulse 61   Ht 5' 8.5" (1.74 m)   Wt 193 lb 3.2 oz (87.6 kg)   SpO2 96%   BMI 28.95 kg/m     Wt Readings from Last 3 Encounters:  06/04/22 193 lb 3.2 oz (87.6 kg)  12/05/21 208 lb 9.6 oz (94.6 kg)  11/05/21 208 lb 9.6 oz (94.6 kg)     GEN: Overweight. No acute distress HEENT: Normal NECK: No JVD. LYMPHATICS: No lymphadenopathy CARDIAC: No murmur. RRR no gallop, or edema. VASCULAR:  Normal Pulses. No bruits. RESPIRATORY:  Clear to auscultation without rales, wheezing or rhonchi  ABDOMEN: Soft, non-tender, non-distended, No pulsatile mass, MUSCULOSKELETAL: No deformity  SKIN: Warm and dry NEUROLOGIC:  Alert and oriented x 3 PSYCHIATRIC:  Normal affect   ASSESSMENT:    1. Essential hypertension   2. Coronary artery disease involving native coronary artery of native heart without angina pectoris   3. Ascending aortic aneurysm, unspecified whether ruptured ([place])   4. Hyperlipidemia LDL goal <70   5. LVH (left ventricular hypertrophy)   6. Bifascicular block    PLAN:    In order of problems listed above:  Continue chlorthalidone and start amlodipine 5 mg daily.  Monitor the blood pressure 2 to 5 hours after both medications are taken in the morning.  Target 130/80 mmHg. She should consider taking a baby aspirin, 81 mg 3 times per week.  We will again try to treat her lipids. Unable to use beta-blocker or ARB/ACE therapy.  Our primary goal will be blood pressure control, with amlodipine being started today. Start pravastatin 10 mg Monday, Wednesday, and Friday.  2 weeks later start ezetimibe 10 mg daily.  Check liver and lipid panel 6 weeks after starting pravastatin.  May need lipid clinic. She has not been able to tolerate medications that can decrease left ventricular hypertrophy.  This will need to be revisited. She is asymptomatic as long as she has not taking medications.  A cardiac MRI may provide  some useful information and should be considered at some point in the future.   Medication Adjustments/Labs and Tests Ordered: Current medicines are reviewed at length with the patient today.  Concerns regarding medicines are outlined above.  Orders Placed This Encounter  Procedures   Lipid panel   Hepatic function panel   EKG 12-Lead   Meds ordered this encounter  Medications   pravastatin (PRAVACHOL) 10 MG tablet    Sig: Take 1 tablet (10 mg total) by mouth 3 (three) times a week. Take on Mondays, Wednesdays, and Fridays.    Dispense:  40 tablet    Refill:  3   ezetimibe (ZETIA) 10 MG tablet    Sig: Take 1 tablet (10 mg total) by mouth daily.    Dispense:  90 tablet    Refill:  3   amLODipine (NORVASC) 5 MG tablet    Sig: Take 1 tablet (5 mg total) by mouth daily.    Dispense:  90 tablet    Refill:  3    Patient Instructions  Medication Instructions:  Your physician has recommended you make the following change in your medication:   1) START pravastatin (Pravachol) '10mg'$  three times weekly on Mondays, Wednesdays, and Fridays. 2) START ezetimibe (Zetia) '10mg'$  daily after 2 weeks on pravastatin if tolerating well 3) START amlodipine '5mg'$  daily  *If you need a refill on your cardiac medications before your next appointment, please call your pharmacy*  Lab Work: In 6 weeks: Lipid and Liver panel If you have labs (blood work) drawn today and your tests are completely normal, you will receive your results only by: [place] (if you have MyChart) OR A paper copy in the mail If you have any lab test that is abnormal or we need to change your treatment, we will call you to review the results.  Testing/Procedures: NONE  Follow-Up: At [facility], you and your health needs are our priority.  As part of our continuing mission to provide you with exceptional heart care, we have created designated Provider Care Teams.  These Care Teams include your primary  Cardiologist (physician) and Advanced Practice Providers (APPs -  Physician Assistants and Nurse Practitioners) who all work together to provide you with the care you need, when you need it.  Your next appointment:   6 month(s)  The format for your next appointment:   In Person  Provider:   Freada Bergeron, MD  Other Instructions Continue to monitor blood pressure at home over the next week, call results to our office at [phone], or send readings via MyChart message. Target is 130/80 mmHg or less.  HOW TO TAKE YOUR BLOOD PRESSURE: Rest 5 minutes before taking your blood pressure.  Don't smoke or drink caffeinated beverages for at least 30 minutes before. Take your blood pressure before (not after) you eat. Sit comfortably with your back supported and both feet on the floor (don't cross your legs). Elevate your arm to heart level on a table or a desk. Use the proper sized cuff. It should fit smoothly and snugly around your bare upper arm. There should be enough room to slip a fingertip under the cuff. The bottom edge of the cuff should be 1 inch above the crease of the elbow. Please monitor your blood pressure and if your blood pressure consistently remains above 130 on the top or 80 on the bottom x3 please call our office at [phone] or send a MyChart message   Important Information About Sugar         Signed, Sinclair Grooms, MD  06/04/2022 12:23 PM    [place]

## 2022-06-04 ENCOUNTER — Encounter: Payer: Self-pay | Admitting: Interventional Cardiology

## 2022-06-04 ENCOUNTER — Other Ambulatory Visit (HOSPITAL_COMMUNITY): Payer: Self-pay

## 2022-06-04 ENCOUNTER — Ambulatory Visit: Payer: BC Managed Care – PPO | Attending: Interventional Cardiology | Admitting: Interventional Cardiology

## 2022-06-04 VITALS — BP 160/94 | HR 61 | Ht 68.5 in | Wt 193.2 lb

## 2022-06-04 DIAGNOSIS — I7121 Aneurysm of the ascending aorta, without rupture: Secondary | ICD-10-CM

## 2022-06-04 DIAGNOSIS — I517 Cardiomegaly: Secondary | ICD-10-CM

## 2022-06-04 DIAGNOSIS — E785 Hyperlipidemia, unspecified: Secondary | ICD-10-CM

## 2022-06-04 DIAGNOSIS — I10 Essential (primary) hypertension: Secondary | ICD-10-CM

## 2022-06-04 DIAGNOSIS — I452 Bifascicular block: Secondary | ICD-10-CM

## 2022-06-04 DIAGNOSIS — I2510 Atherosclerotic heart disease of native coronary artery without angina pectoris: Secondary | ICD-10-CM | POA: Diagnosis not present

## 2022-06-04 MED ORDER — EZETIMIBE 10 MG PO TABS
10.0000 mg | ORAL_TABLET | Freq: Every day | ORAL | 3 refills | Status: DC
  Filled 2022-06-04: qty 90, 90d supply, fill #0

## 2022-06-04 MED ORDER — AMLODIPINE BESYLATE 5 MG PO TABS
5.0000 mg | ORAL_TABLET | Freq: Every day | ORAL | 3 refills | Status: DC
  Filled 2022-06-04: qty 90, 90d supply, fill #0

## 2022-06-04 MED ORDER — PRAVASTATIN SODIUM 10 MG PO TABS
10.0000 mg | ORAL_TABLET | ORAL | 3 refills | Status: DC
Start: 2022-06-05 — End: 2022-07-22
  Filled 2022-06-04: qty 36, 84d supply, fill #0

## 2022-06-04 NOTE — Patient Instructions (Signed)
Medication Instructions:  Your physician has recommended you make the following change in your medication:   1) START pravastatin (Pravachol) '10mg'$  three times weekly on Mondays, Wednesdays, and Fridays. 2) START ezetimibe (Zetia) '10mg'$  daily after 2 weeks on pravastatin if tolerating well 3) START amlodipine '5mg'$  daily  *If you need a refill on your cardiac medications before your next appointment, please call your pharmacy*  Lab Work: In 6 weeks: Lipid and Liver panel If you have labs (blood work) drawn today and your tests are completely normal, you will receive your results only by: [place] (if you have MyChart) OR A paper copy in the mail If you have any lab test that is abnormal or we need to change your treatment, we will call you to review the results.  Testing/Procedures: NONE  Follow-Up: At [facility], you and your health needs are our priority.  As part of our continuing mission to provide you with exceptional heart care, we have created designated Provider Care Teams.  These Care Teams include your primary Cardiologist (physician) and Advanced Practice Providers (APPs -  Physician Assistants and Nurse Practitioners) who all work together to provide you with the care you need, when you need it.  Your next appointment:   6 month(s)  The format for your next appointment:   In Person  Provider:   Freada Bergeron, MD  Other Instructions Continue to monitor blood pressure at home over the next week, call results to our office at [phone], or send readings via MyChart message. Target is 130/80 mmHg or less.  HOW TO TAKE YOUR BLOOD PRESSURE: Rest 5 minutes before taking your blood pressure.  Don't smoke or drink caffeinated beverages for at least 30 minutes before. Take your blood pressure before (not after) you eat. Sit comfortably with your back supported and both feet on the floor (don't cross your legs). Elevate your arm to heart level on a table  or a desk. Use the proper sized cuff. It should fit smoothly and snugly around your bare upper arm. There should be enough room to slip a fingertip under the cuff. The bottom edge of the cuff should be 1 inch above the crease of the elbow. Please monitor your blood pressure and if your blood pressure consistently remains above 130 on the top or 80 on the bottom x3 please call our office at [phone] or send a MyChart message   Important Information About Sugar

## 2022-06-10 ENCOUNTER — Other Ambulatory Visit (HOSPITAL_COMMUNITY): Payer: Self-pay

## 2022-06-12 ENCOUNTER — Other Ambulatory Visit (HOSPITAL_COMMUNITY): Payer: Self-pay

## 2022-06-25 ENCOUNTER — Other Ambulatory Visit (HOSPITAL_COMMUNITY): Payer: Self-pay

## 2022-07-02 ENCOUNTER — Other Ambulatory Visit (HOSPITAL_COMMUNITY): Payer: Self-pay

## 2022-07-08 ENCOUNTER — Other Ambulatory Visit (HOSPITAL_COMMUNITY): Payer: Self-pay

## 2022-07-08 MED ORDER — PANTOPRAZOLE SODIUM 40 MG PO TBEC
40.0000 mg | DELAYED_RELEASE_TABLET | Freq: Every day | ORAL | 0 refills | Status: DC
  Filled 2022-07-08: qty 90, 90d supply, fill #0

## 2022-07-12 ENCOUNTER — Other Ambulatory Visit (HOSPITAL_COMMUNITY): Payer: Self-pay

## 2022-07-16 ENCOUNTER — Ambulatory Visit: Payer: BC Managed Care – PPO | Attending: Interventional Cardiology

## 2022-07-16 DIAGNOSIS — E785 Hyperlipidemia, unspecified: Secondary | ICD-10-CM

## 2022-07-16 LAB — LIPID PANEL
Chol/HDL Ratio: 5.2 ratio — ABNORMAL HIGH (ref 0.0–4.4)
Cholesterol, Total: 300 mg/dL — ABNORMAL HIGH (ref 100–199)
HDL: 58 mg/dL (ref >39)
LDL Chol Calc (NIH): 214 mg/dL — ABNORMAL HIGH (ref 0–99)
Triglycerides: 153 mg/dL — ABNORMAL HIGH (ref 0–149)
VLDL Cholesterol Cal: 28 mg/dL (ref 5–40)

## 2022-07-16 LAB — HEPATIC FUNCTION PANEL
ALT: 30 IU/L (ref 0–32)
AST: 33 IU/L (ref 0–40)
Albumin: 4.5 g/dL (ref 3.9–4.9)
Alkaline Phosphatase: 68 IU/L (ref 44–121)
Bilirubin Total: 0.7 mg/dL (ref 0.0–1.2)
Bilirubin, Direct: 0.16 mg/dL (ref 0.00–0.40)
Total Protein: 7.1 g/dL (ref 6.0–8.5)

## 2022-07-17 ENCOUNTER — Encounter: Payer: Self-pay | Admitting: Interventional Cardiology

## 2022-07-22 ENCOUNTER — Other Ambulatory Visit: Payer: Self-pay | Admitting: Interventional Cardiology

## 2022-07-22 DIAGNOSIS — E785 Hyperlipidemia, unspecified: Secondary | ICD-10-CM

## 2022-07-22 NOTE — Progress Notes (Signed)
Referral to Lipid Clinic per Dr. Tamala Julian.

## 2022-08-18 ENCOUNTER — Other Ambulatory Visit (HOSPITAL_COMMUNITY): Payer: Self-pay

## 2022-08-19 ENCOUNTER — Other Ambulatory Visit: Payer: Self-pay

## 2022-08-27 ENCOUNTER — Other Ambulatory Visit (HOSPITAL_COMMUNITY): Payer: Self-pay

## 2022-08-27 ENCOUNTER — Other Ambulatory Visit: Payer: Self-pay

## 2022-09-05 ENCOUNTER — Ambulatory Visit: Payer: BC Managed Care – PPO

## 2022-09-26 ENCOUNTER — Other Ambulatory Visit: Payer: Self-pay

## 2022-09-26 ENCOUNTER — Other Ambulatory Visit (HOSPITAL_COMMUNITY): Payer: Self-pay

## 2022-09-26 MED ORDER — ZOLPIDEM TARTRATE ER 12.5 MG PO TBCR
12.5000 mg | EXTENDED_RELEASE_TABLET | Freq: Every evening | ORAL | 1 refills | Status: DC | PRN
  Filled 2022-09-26: qty 30, 30d supply, fill #0

## 2022-09-26 MED ORDER — METHOCARBAMOL 500 MG PO TABS
500.0000 mg | ORAL_TABLET | ORAL | 0 refills | Status: DC | PRN
  Filled 2022-09-26: qty 30, 5d supply, fill #0

## 2022-09-26 MED ORDER — CITALOPRAM HYDROBROMIDE 20 MG PO TABS
20.0000 mg | ORAL_TABLET | Freq: Every day | ORAL | 1 refills | Status: DC
  Filled 2022-09-26: qty 90, 90d supply, fill #0
  Filled 2023-01-10: qty 90, 90d supply, fill #1

## 2022-09-26 MED ORDER — ALPRAZOLAM 0.5 MG PO TABS
0.5000 mg | ORAL_TABLET | Freq: Every evening | ORAL | 5 refills | Status: DC | PRN
  Filled 2022-09-26: qty 30, 30d supply, fill #0
  Filled 2022-11-28: qty 30, 30d supply, fill #1
  Filled 2023-01-10: qty 30, 30d supply, fill #2
  Filled 2023-02-15: qty 30, 30d supply, fill #3

## 2022-10-11 ENCOUNTER — Other Ambulatory Visit (HOSPITAL_COMMUNITY): Payer: Self-pay

## 2022-10-11 ENCOUNTER — Other Ambulatory Visit: Payer: Self-pay

## 2022-10-14 ENCOUNTER — Ambulatory Visit: Payer: BC Managed Care – PPO

## 2022-11-28 ENCOUNTER — Other Ambulatory Visit (HOSPITAL_COMMUNITY): Payer: Self-pay

## 2022-11-28 ENCOUNTER — Other Ambulatory Visit: Payer: Self-pay

## 2022-11-29 ENCOUNTER — Other Ambulatory Visit (HOSPITAL_COMMUNITY): Payer: Self-pay

## 2022-11-29 MED ORDER — ZOLPIDEM TARTRATE ER 12.5 MG PO TBCR
12.5000 mg | EXTENDED_RELEASE_TABLET | Freq: Every evening | ORAL | 1 refills | Status: DC | PRN
  Filled 2022-11-29: qty 30, 30d supply, fill #0
  Filled 2023-04-09: qty 30, 30d supply, fill #1

## 2022-12-09 ENCOUNTER — Other Ambulatory Visit (HOSPITAL_COMMUNITY): Payer: Self-pay

## 2022-12-13 IMAGING — MR MR FOOT*L* W/O CM
5 series · 40 of 40 positions shown · non-contrast
Comparison: None.

CLINICAL DATA: Status post fall February 11, 2021.  Pain.

EXAM:
MRI OF THE LEFT FOOT WITHOUT CONTRAST
TECHNIQUE: Multiplanar, multisequence MR imaging of the left foot was
performed. No intravenous contrast was administered.

[Series 6: T1 · coronal · left · 3.0mm · 0.52mm/px · 11 of 45 slices shown (1 of 2)]
[im 1/45]
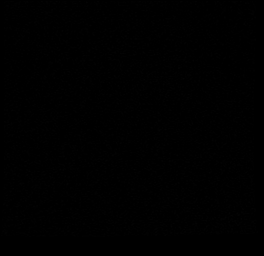
[im 5/45]
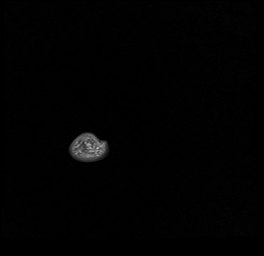
[im 9/45]
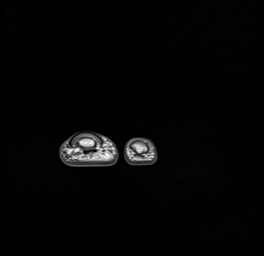
[im 14/45]
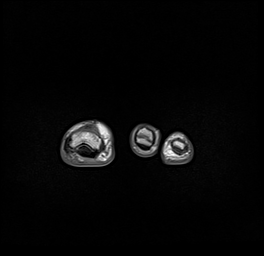
[im 18/45]
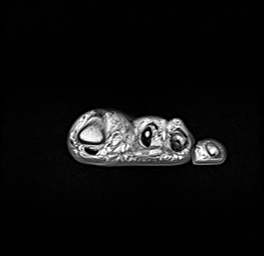
[im 23/45]
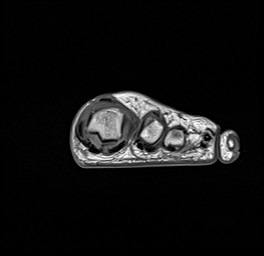
[im 27/45]
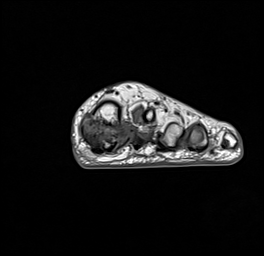
[im 31/45]
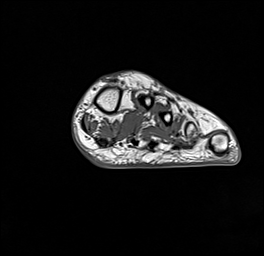
[im 36/45]
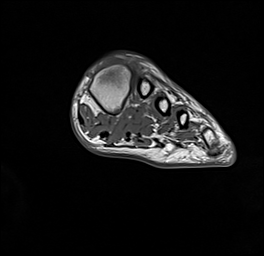
[im 40/45]
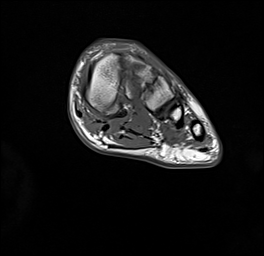
[im 45/45]
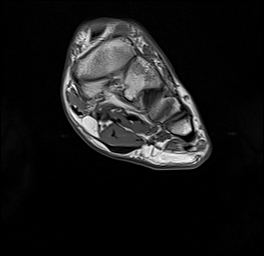

[Series 7: T2 fat-sat · coronal · left · 3.0mm · 0.38mm/px · 10 of 45 slices shown (1 of 2)]
[im 1/45]
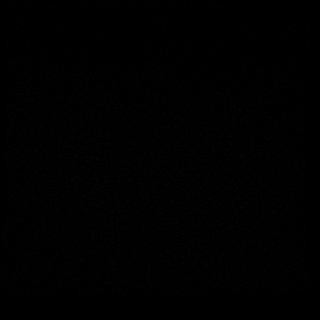
[im 5/45]
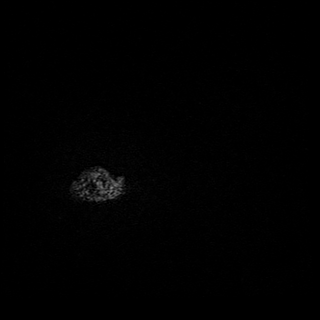
[im 10/45]
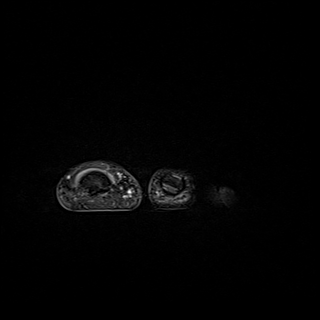
[im 15/45]
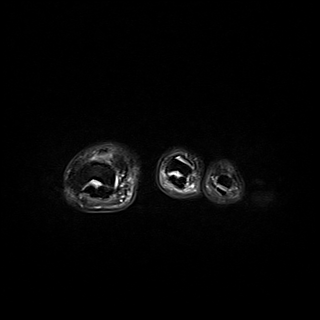
[im 20/45]
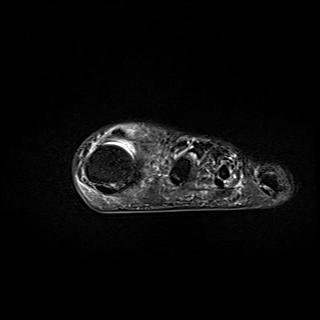
[im 25/45]
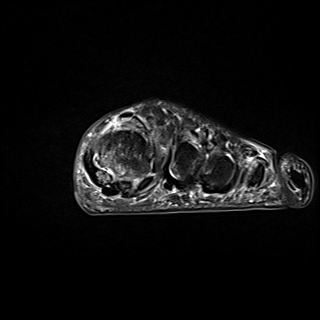
[im 30/45]
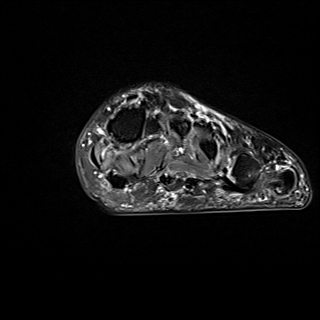
[im 35/45]
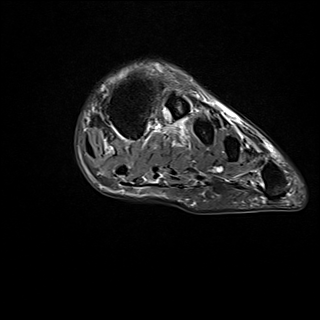
[im 40/45]
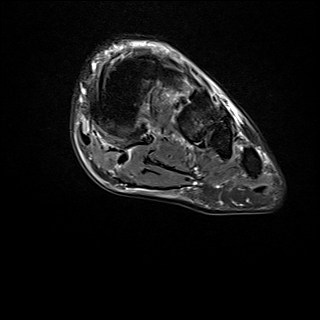
[im 45/45]
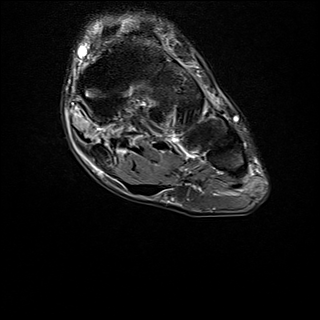

[Series 8: T2 fat-sat · axial · left · 3.0mm · 0.70mm/px · z∈[-122,-34]mm · 6 of 26 slices shown (2 of 2)]
[im 1/26]
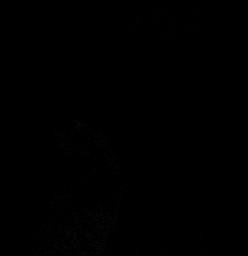
[im 6/26]
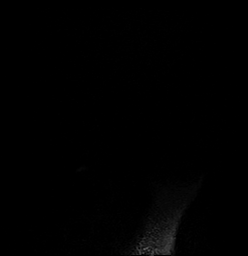
[im 11/26]
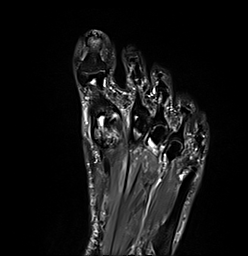
[im 16/26]
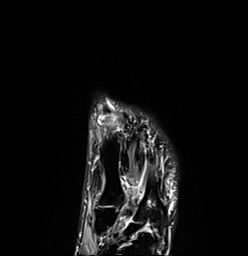
[im 21/26]
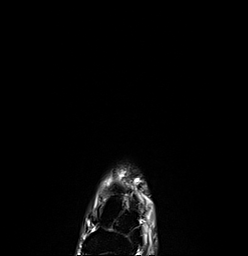
[im 26/26]
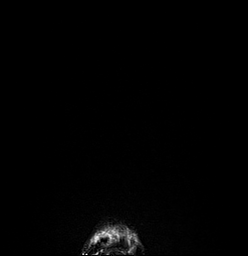

[Series 9: T1 · axial · left · 3.0mm · 0.70mm/px · z∈[-122,-34]mm · 6 of 26 slices shown (2 of 2)]
[im 1/26]
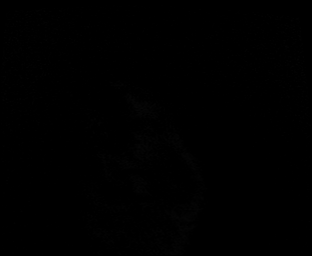
[im 6/26]
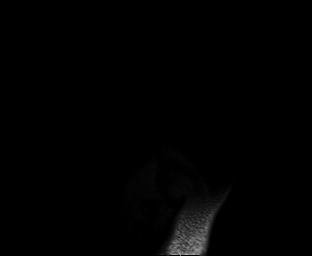
[im 11/26]
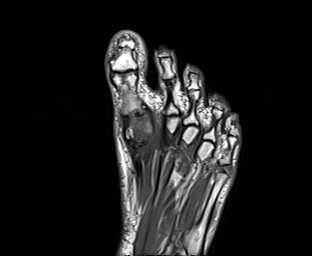
[im 16/26]
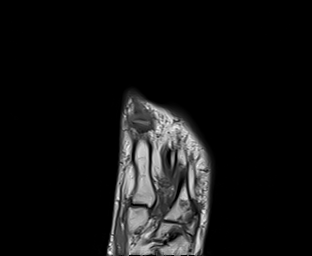
[im 21/26]
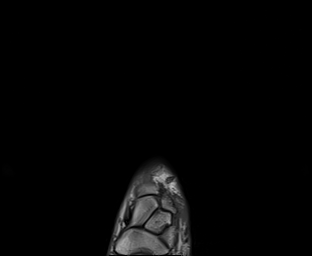
[im 26/26]
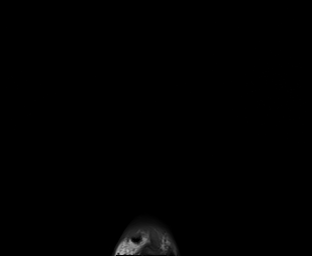

[Series 10: STIR · sagittal · left · 3.0mm · 0.35mm/px · 7 of 31 slices shown]
[im 1/31]
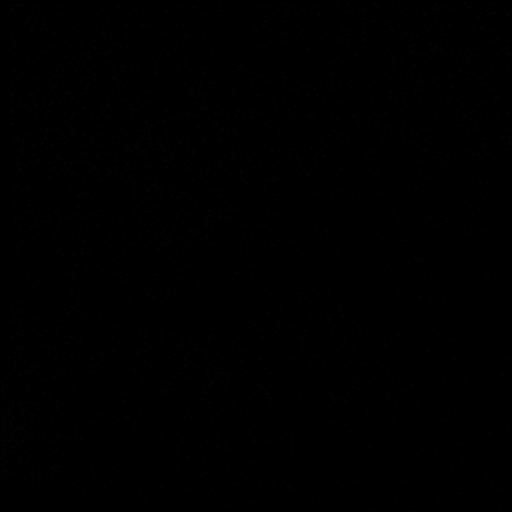
[im 6/31]
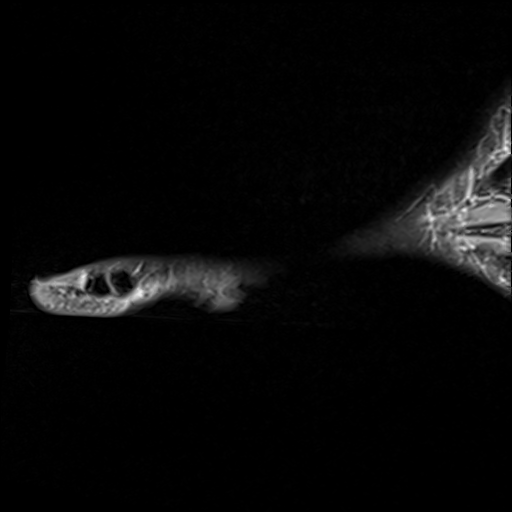
[im 11/31]
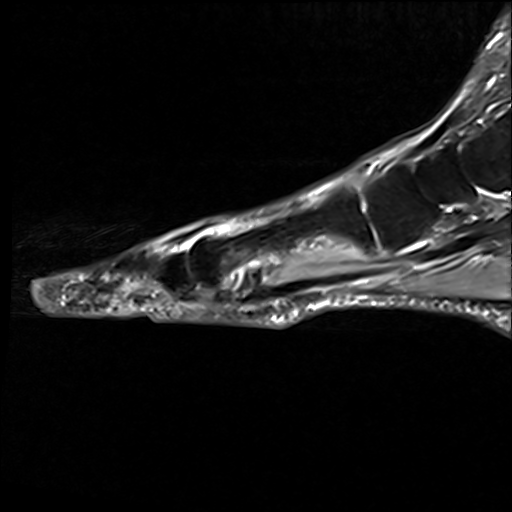
[im 16/31]
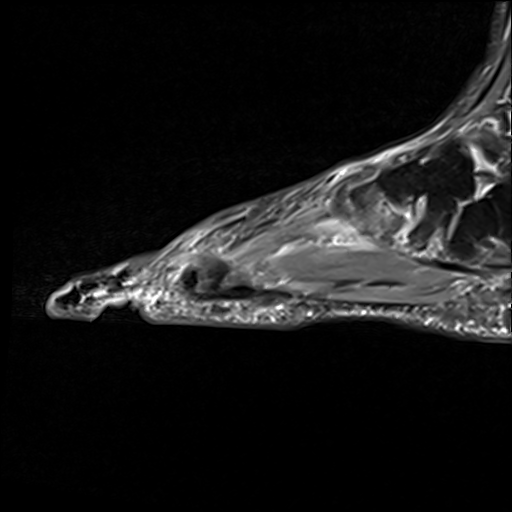
[im 21/31]
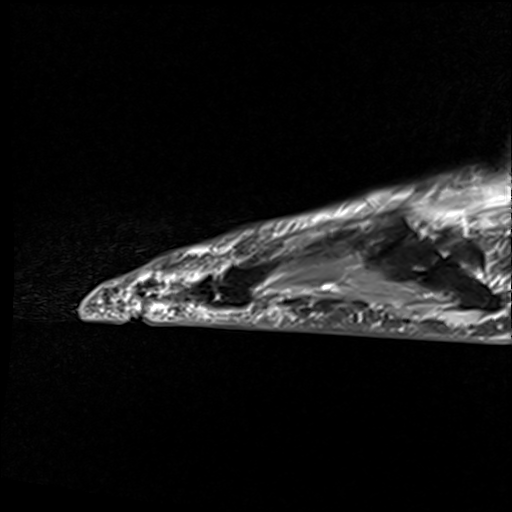
[im 26/31]
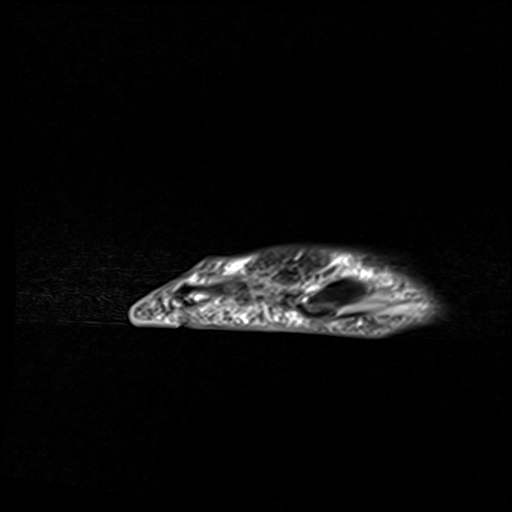
[im 31/31]
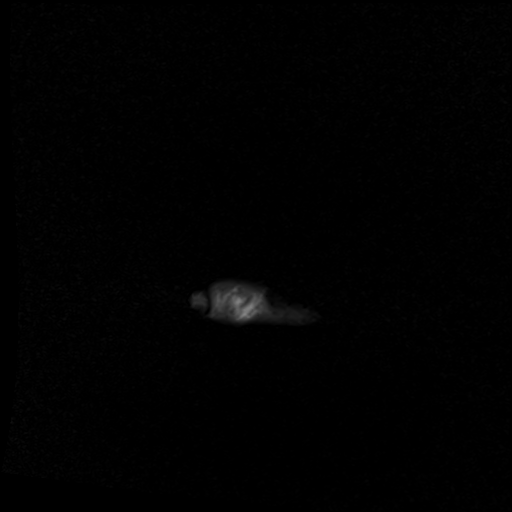

[40 of 40 positions shown; findings below may reference images not displayed]

FINDINGS: Bones/Joint/Cartilage

Mild marrow edema at the base of the third metatarsal likely
reflecting an osseous contusion versus stress reaction. Bone marrow
edema on either side of the second TMT joint consistent with osseous
contusions with a nondisplaced fracture along the distal plantar
aspect of the middle cuneiform. Normal alignment. No joint effusion.

Severe osteoarthritis of the first MTP joint with mild medial
subluxation of the first proximal phalanx.

Bone marrow edema in the medial and lateral hallux sesamoid and the
first metatarsal head likely reflecting osteoarthritic changes.

Ligaments

Collateral ligaments are intact.  Lisfranc ligament is intact.

Muscles and Tendons

Flexor, peroneal and extensor compartment tendons are intact.
Muscles are normal.

Soft tissue
No fluid collection or hematoma.  No soft tissue mass.
IMPRESSION: 1. Mild marrow edema at the base of the third metatarsal likely
reflecting an osseous contusion versus stress reaction.
2. Bone marrow edema on either side of the second TMT joint
consistent with osseous contusions with a nondisplaced fracture
along the distal plantar aspect of the middle cuneiform.
3. Severe osteoarthritis of the first MTP joint with mild medial
subluxation of the first proximal phalanx.
4. Bone marrow edema in the medial and lateral hallux sesamoid and
the first metatarsal head likely reflecting osteoarthritic changes.

## 2023-01-10 ENCOUNTER — Other Ambulatory Visit (HOSPITAL_COMMUNITY): Payer: Self-pay

## 2023-01-16 ENCOUNTER — Other Ambulatory Visit (HOSPITAL_COMMUNITY): Payer: Self-pay

## 2023-01-20 NOTE — Progress Notes (Signed)
Cardiology Office Note:   Date:  01/29/2023  ID:  GANELL GALANOS, DOB Jan 07, 1961, MRN [id number]  History of Present Illness:   BINAH ABELLA is a 62 y.o. female anxiety, HLD, HTN, and dilated aorta, and coronary artery calcification who presents to clinic for follow-up. Was previously followed by Dr. Katrinka Blazing.  Patient with history of Ca score 09/2021 of 41 (80%). Has not tolerated statin therapy. TTE 10/2021 with LVEF 60-65%, normal RV, trivial MR, aorta 38mm.  Today, the patient overall feels well. She remains very active and is working on lifestyle modifications. States she lost 30lbs since retiring States she has very difficult time with medications and has not been able to tolerate many due to side effect. She feels well with no chest pain, SOB, orthopnea, LE edema, or PND. She does not wish to try injection therapy but is willing to try zetia.   Blood pressure has been running 110s-130s/70-80s.  Past Medical History:  Diagnosis Date   Anxiety    Arthritis    Elevated LDL cholesterol level    Foot pain    GERD (gastroesophageal reflux disease)    Hyperlipidemia    Hypertension    Insomnia    Leukocytoclastic vasculitis (HCC)    Neck pain      ROS: As per HPI  Studies Reviewed:    EKG:  No new tracing  Cardiac Studies & Procedures       ECHOCARDIOGRAM  ECHOCARDIOGRAM COMPLETE 11/12/2021  Narrative ECHOCARDIOGRAM REPORT    Patient Name:   CHANNIN MERKL Date of Exam: 11/12/2021 Medical Rec #:  [id number]        Height:       68.5 in Accession #:    [id number]       Weight:       208.6 lb Date of Birth:  [birth year]         BSA:          2.092 m Patient Age:    60 years         BP:           132/88 mmHg Patient Gender: F                HR:           64 bpm. Exam Location:  [address]  Procedure: 2D Echo, Cardiac Doppler and Color Doppler  Indications:    I25.10 CAD  History:        Patient has no prior history of Echocardiogram examinations. CAD, Abnormal  ECG, Ascending aortic aneurysm; Risk Factors:Hypertension and Dyslipidemia.  Sonographer:    Samule Ohm RDCS Referring Phys: [phone] Barry Dienes [facility]  IMPRESSIONS   1. Left ventricular ejection fraction, by estimation, is 60 to 65%. The left ventricle has normal function. The left ventricle has no regional wall motion abnormalities. There is mild concentric left ventricular hypertrophy. Left ventricular diastolic parameters were normal. 2. Right ventricular systolic function is normal. The right ventricular size is normal. Tricuspid regurgitation signal is inadequate for assessing PA pressure. 3. The mitral valve is normal in structure. Trivial mitral valve regurgitation. 4. The aortic valve is tricuspid. There is mild thickening of the aortic valve. Aortic valve regurgitation is not visualized. Aortic valve sclerosis is present, with no evidence of aortic valve stenosis. 5. Aortic dilatation noted. There is mild dilatation of the ascending aorta, measuring 38 mm. 6. The inferior vena cava is normal in size with greater than 50% respiratory variability, suggesting right  atrial pressure of 3 mmHg.  Comparison(s): No prior Echocardiogram.  FINDINGS Left Ventricle: Left ventricular ejection fraction, by estimation, is 60 to 65%. The left ventricle has normal function. The left ventricle has no regional wall motion abnormalities. The left ventricular internal cavity size was normal in size. There is mild concentric left ventricular hypertrophy. Left ventricular diastolic parameters were normal.  Right Ventricle: The right ventricular size is normal. No increase in right ventricular wall thickness. Right ventricular systolic function is normal. Tricuspid regurgitation signal is inadequate for assessing PA pressure.  Left Atrium: Left atrial size was normal in size.  Right Atrium: Right atrial size was normal in size.  Pericardium: There is no evidence of pericardial effusion.  Mitral Valve:  The mitral valve is normal in structure. Trivial mitral valve regurgitation.  Tricuspid Valve: The tricuspid valve is normal in structure. Tricuspid valve regurgitation is trivial.  Aortic Valve: The aortic valve is tricuspid. There is mild thickening of the aortic valve. Aortic valve regurgitation is not visualized. Aortic valve sclerosis is present, with no evidence of aortic valve stenosis.  Pulmonic Valve: The pulmonic valve was normal in structure. Pulmonic valve regurgitation is trivial.  Aorta: Aortic dilatation noted. There is mild dilatation of the ascending aorta, measuring 38 mm.  Venous: The inferior vena cava is normal in size with greater than 50% respiratory variability, suggesting right atrial pressure of 3 mmHg.  IAS/Shunts: The atrial septum is grossly normal.   LEFT VENTRICLE PLAX 2D LVIDd:         4.10 cm   Diastology LVIDs:         2.40 cm   LV e' medial:    10.60 cm/s LV PW:         1.20 cm   LV E/e' medial:  9.3 LV IVS:        1.30 cm   LV e' lateral:   16.40 cm/s LVOT diam:     1.80 cm   LV E/e' lateral: 6.0 LV SV:         66 LV SV Index:   32 LVOT Area:     2.54 cm   RIGHT VENTRICLE             IVC RV S prime:     16.10 cm/s  IVC diam: 1.70 cm TAPSE (M-mode): 2.1 cm  LEFT ATRIUM             Index        RIGHT ATRIUM           Index LA diam:        3.90 cm 1.86 cm/m   RA Pressure: 3.00 mmHg LA Vol (A2C):   78.4 ml 37.48 ml/m  RA Area:     12.70 cm LA Vol (A4C):   64.1 ml 30.64 ml/m  RA Volume:   28.80 ml  13.77 ml/m LA Biplane Vol: 74.7 ml 35.71 ml/m AORTIC VALVE LVOT Vmax:   117.00 cm/s LVOT Vmean:  78.400 cm/s LVOT VTI:    0.261 m  AORTA Ao Root diam: 3.50 cm Ao Asc diam:  3.85 cm  MITRAL VALVE               TRICUSPID VALVE MV Area (PHT): 3.00 cm    Estimated RAP:  3.00 mmHg MV Decel Time: 253 msec MV E velocity: 98.50 cm/s  SHUNTS MV A velocity: 76.50 cm/s  Systemic VTI:  0.26 m MV E/A ratio:  1.29  Systemic Diam: 1.80  cm  Laurance Flatten MD Electronically signed by Laurance Flatten MD Signature Date/Time: 11/12/2021/6:00:19 PM    Final              Risk Assessment/Calculations:           Physical Exam:   VS:  BP (!) 154/84   Pulse 65   Ht 5' 8.5" (1.74 m)   Wt 178 lb 3.2 oz (80.8 kg)   SpO2 97%   BMI 26.70 kg/m    Wt Readings from Last 3 Encounters:  01/29/23 178 lb 3.2 oz (80.8 kg)  06/04/22 193 lb 3.2 oz (87.6 kg)  12/05/21 208 lb 9.6 oz (94.6 kg)     GEN: Well nourished, well developed in no acute distress NECK: No JVD; No carotid bruits CARDIAC: RRR, no murmurs, rubs, gallops RESPIRATORY:  Clear to auscultation without rales, wheezing or rhonchi  ABDOMEN: Soft, non-tender, non-distended EXTREMITIES:  No edema; No deformity   ASSESSMENT AND PLAN:   #Coronary Artery Ca: -Ca score 41 (80%) -Unable to tolerate statin therapy and does not wish to trial PCSK9i at this time -Willing to trial zetia 10mg  daily -Continue lifestyle modifications   #HLD: -LDL very elevated 184 -Did not tolerate statin therapy and declined wanting to start PCSK9i -Willing to trial zetia 10mg  daily -Repeat lipids in 8 weeks  #Dilated aorta: -Ascending aorta 4.2cm -Continue serial monitoring with repeat TTE this year -BP better controlled at home; off all medications due to side effects  #HTN: -Off all blood pressure medications as did not tolerate them -Has lost 30lbs and blood pressures are much better controlled at home (110-130s/70-80s) -Declined wanting to start any new medications at this time        Signed, Meriam Sprague, MD

## 2023-01-27 ENCOUNTER — Other Ambulatory Visit (HOSPITAL_COMMUNITY): Payer: Self-pay

## 2023-01-28 ENCOUNTER — Other Ambulatory Visit (HOSPITAL_COMMUNITY): Payer: Self-pay

## 2023-01-29 ENCOUNTER — Other Ambulatory Visit: Payer: Self-pay

## 2023-01-29 ENCOUNTER — Encounter: Payer: Self-pay | Admitting: Cardiology

## 2023-01-29 ENCOUNTER — Ambulatory Visit: Payer: BC Managed Care – PPO | Attending: Cardiology | Admitting: Cardiology

## 2023-01-29 ENCOUNTER — Other Ambulatory Visit (HOSPITAL_COMMUNITY): Payer: Self-pay

## 2023-01-29 VITALS — BP 154/84 | HR 65 | Ht 68.5 in | Wt 178.2 lb

## 2023-01-29 DIAGNOSIS — I10 Essential (primary) hypertension: Secondary | ICD-10-CM

## 2023-01-29 DIAGNOSIS — E785 Hyperlipidemia, unspecified: Secondary | ICD-10-CM

## 2023-01-29 DIAGNOSIS — I2510 Atherosclerotic heart disease of native coronary artery without angina pectoris: Secondary | ICD-10-CM

## 2023-01-29 DIAGNOSIS — Z79899 Other long term (current) drug therapy: Secondary | ICD-10-CM

## 2023-01-29 DIAGNOSIS — I77819 Aortic ectasia, unspecified site: Secondary | ICD-10-CM

## 2023-01-29 DIAGNOSIS — I7121 Aneurysm of the ascending aorta, without rupture: Secondary | ICD-10-CM

## 2023-01-29 MED ORDER — EZETIMIBE 10 MG PO TABS
10.0000 mg | ORAL_TABLET | Freq: Every day | ORAL | 3 refills | Status: DC
Start: 2023-01-29 — End: 2024-07-05
  Filled 2023-01-29: qty 90, 90d supply, fill #0

## 2023-01-29 NOTE — Patient Instructions (Signed)
Medication Instructions:   START TAKING ZETIA 10 MG BY MOUTH DAILY  *If you need a refill on your cardiac medications before your next appointment, please call your pharmacy*   Lab Work:  IN 8 WEEKS HERE IN THE OFFICE--LIPIDS--PLEASE COME FASTING TO THIS LAB APPOINTMENT  If you have labs (blood work) drawn today and your tests are completely normal, you will receive your results only by: MyChart Message (if you have MyChart) OR A paper copy in the mail If you have any lab test that is abnormal or we need to change your treatment, we will call you to review the results.   Testing/Procedures:  Your physician has requested that you have an echocardiogram. Echocardiography is a painless test that uses sound waves to create images of your heart. It provides your doctor with information about the size and shape of your heart and how well your heart's chambers and valves are working. This procedure takes approximately one hour. There are no restrictions for this procedure. Please do NOT wear cologne, perfume, aftershave, or lotions (deodorant is allowed). Please arrive 15 minutes prior to your appointment time.    Follow-Up: At [facility], you and your health needs are our priority.  As part of our continuing mission to provide you with exceptional heart care, we have created designated Provider Care Teams.  These Care Teams include your primary Cardiologist (physician) and Advanced Practice Providers (APPs -  Physician Assistants and Nurse Practitioners) who all work together to provide you with the care you need, when you need it.  We recommend signing up for the patient portal called "MyChart".  Sign up information is provided on this After Visit Summary.  MyChart is used to connect with patients for Virtual Visits (Telemedicine).  Patients are able to view lab/test results, encounter notes, upcoming appointments, etc.  Non-urgent messages can be sent to your provider as well.    To learn more about what you can do with MyChart, go to [email or web address].    Your next appointment:   1 year(s)  Provider:   DR. Verne Carrow

## 2023-02-05 ENCOUNTER — Other Ambulatory Visit (HOSPITAL_COMMUNITY): Payer: Self-pay

## 2023-02-06 ENCOUNTER — Other Ambulatory Visit (HOSPITAL_COMMUNITY): Payer: Self-pay

## 2023-02-07 ENCOUNTER — Other Ambulatory Visit (HOSPITAL_COMMUNITY): Payer: Self-pay

## 2023-02-10 ENCOUNTER — Other Ambulatory Visit (HOSPITAL_COMMUNITY): Payer: Self-pay

## 2023-02-15 ENCOUNTER — Other Ambulatory Visit (HOSPITAL_COMMUNITY): Payer: Self-pay

## 2023-02-17 ENCOUNTER — Other Ambulatory Visit: Payer: Self-pay

## 2023-02-17 ENCOUNTER — Other Ambulatory Visit (HOSPITAL_COMMUNITY): Payer: Self-pay

## 2023-02-18 ENCOUNTER — Other Ambulatory Visit: Payer: Self-pay

## 2023-03-10 ENCOUNTER — Ambulatory Visit: Payer: BC Managed Care – PPO | Admitting: Cardiology

## 2023-03-12 ENCOUNTER — Other Ambulatory Visit (HOSPITAL_COMMUNITY): Payer: Self-pay

## 2023-03-12 MED ORDER — PROGESTERONE 200 MG PO CAPS
200.0000 mg | ORAL_CAPSULE | Freq: Every day | ORAL | 0 refills | Status: DC
  Filled 2023-03-12: qty 90, 90d supply, fill #0

## 2023-03-27 ENCOUNTER — Ambulatory Visit: Payer: BC Managed Care – PPO

## 2023-03-27 ENCOUNTER — Ambulatory Visit (HOSPITAL_COMMUNITY): Payer: BC Managed Care – PPO | Attending: Cardiology

## 2023-03-27 DIAGNOSIS — Z79899 Other long term (current) drug therapy: Secondary | ICD-10-CM | POA: Diagnosis present

## 2023-03-27 DIAGNOSIS — E785 Hyperlipidemia, unspecified: Secondary | ICD-10-CM | POA: Diagnosis present

## 2023-03-27 DIAGNOSIS — I361 Nonrheumatic tricuspid (valve) insufficiency: Secondary | ICD-10-CM | POA: Diagnosis not present

## 2023-03-27 DIAGNOSIS — I77819 Aortic ectasia, unspecified site: Secondary | ICD-10-CM | POA: Diagnosis not present

## 2023-03-27 LAB — ECHOCARDIOGRAM COMPLETE
Area-P 1/2: 3.62 cm2
S' Lateral: 2.3 cm

## 2023-03-27 LAB — LIPID PANEL
Chol/HDL Ratio: 4 ratio (ref 0.0–4.4)
Cholesterol, Total: 271 mg/dL — ABNORMAL HIGH (ref 100–199)
HDL: 67 mg/dL (ref >39)
LDL Chol Calc (NIH): 190 mg/dL — ABNORMAL HIGH (ref 0–99)
Triglycerides: 86 mg/dL (ref 0–149)
VLDL Cholesterol Cal: 14 mg/dL (ref 5–40)

## 2023-03-31 ENCOUNTER — Telehealth: Payer: Self-pay | Admitting: Cardiology

## 2023-03-31 NOTE — Telephone Encounter (Signed)
Patient returned call about test results.  °

## 2023-03-31 NOTE — Telephone Encounter (Signed)
The patient has been notified of the result and verbalized understanding.  All questions (if any) were answered. Arvid Right Iva Montelongo, RN 03/31/2023 10:27 AM   Pt not agreeable to treatment plan. Please see result note for more details.

## 2023-04-09 ENCOUNTER — Other Ambulatory Visit (HOSPITAL_COMMUNITY): Payer: Self-pay

## 2023-04-10 ENCOUNTER — Other Ambulatory Visit (HOSPITAL_COMMUNITY): Payer: Self-pay

## 2023-04-10 ENCOUNTER — Other Ambulatory Visit: Payer: Self-pay

## 2023-04-10 MED ORDER — ALPRAZOLAM 0.5 MG PO TABS
0.5000 mg | ORAL_TABLET | Freq: Every evening | ORAL | 5 refills | Status: DC | PRN
  Filled 2023-04-10: qty 30, 30d supply, fill #0
  Filled 2023-06-18: qty 30, 30d supply, fill #1
  Filled 2023-07-18 – 2023-07-28 (×3): qty 30, 30d supply, fill #2
  Filled 2023-08-26: qty 30, 30d supply, fill #3
  Filled 2023-09-26: qty 30, 30d supply, fill #4

## 2023-04-10 MED ORDER — ZOLPIDEM TARTRATE ER 12.5 MG PO TBCR
12.5000 mg | EXTENDED_RELEASE_TABLET | Freq: Every evening | ORAL | 0 refills | Status: DC | PRN
  Filled 2023-04-10: qty 30, 30d supply, fill #0

## 2023-04-10 MED ORDER — CITALOPRAM HYDROBROMIDE 20 MG PO TABS
20.0000 mg | ORAL_TABLET | Freq: Every day | ORAL | 1 refills | Status: DC
  Filled 2023-04-10: qty 90, 90d supply, fill #0
  Filled 2023-07-24: qty 90, 90d supply, fill #1

## 2023-04-11 ENCOUNTER — Other Ambulatory Visit (HOSPITAL_COMMUNITY): Payer: Self-pay

## 2023-05-15 ENCOUNTER — Other Ambulatory Visit (HOSPITAL_COMMUNITY): Payer: Self-pay

## 2023-05-21 ENCOUNTER — Other Ambulatory Visit (HOSPITAL_COMMUNITY): Payer: Self-pay

## 2023-05-29 ENCOUNTER — Other Ambulatory Visit (HOSPITAL_COMMUNITY): Payer: Self-pay

## 2023-05-29 MED ORDER — ESTRADIOL 0.1 MG/24HR TD PTTW
1.0000 | MEDICATED_PATCH | TRANSDERMAL | 3 refills | Status: DC
  Filled 2023-05-29 – 2023-05-30 (×2): qty 24, 84d supply, fill #0
  Filled 2023-08-26: qty 24, 84d supply, fill #1
  Filled 2023-11-23: qty 24, 84d supply, fill #2
  Filled 2024-03-09: qty 24, 84d supply, fill #3

## 2023-05-29 MED ORDER — PROGESTERONE 200 MG PO CAPS
200.0000 mg | ORAL_CAPSULE | Freq: Every evening | ORAL | 3 refills | Status: DC
  Filled 2023-05-29 – 2023-05-30 (×2): qty 90, 90d supply, fill #0

## 2023-05-30 ENCOUNTER — Other Ambulatory Visit (HOSPITAL_COMMUNITY): Payer: Self-pay

## 2023-06-02 ENCOUNTER — Other Ambulatory Visit (HOSPITAL_COMMUNITY): Payer: Self-pay

## 2023-06-02 ENCOUNTER — Other Ambulatory Visit: Payer: Self-pay

## 2023-06-18 ENCOUNTER — Other Ambulatory Visit: Payer: Self-pay

## 2023-06-18 ENCOUNTER — Other Ambulatory Visit (HOSPITAL_COMMUNITY): Payer: Self-pay

## 2023-06-19 ENCOUNTER — Other Ambulatory Visit (HOSPITAL_COMMUNITY): Payer: Self-pay

## 2023-06-19 MED ORDER — ZOLPIDEM TARTRATE ER 12.5 MG PO TBCR
12.5000 mg | EXTENDED_RELEASE_TABLET | Freq: Every evening | ORAL | 0 refills | Status: DC | PRN
  Filled 2023-06-19 – 2023-08-26 (×2): qty 30, 30d supply, fill #0

## 2023-07-18 ENCOUNTER — Other Ambulatory Visit (HOSPITAL_COMMUNITY): Payer: Self-pay

## 2023-07-18 ENCOUNTER — Other Ambulatory Visit: Payer: Self-pay

## 2023-07-24 ENCOUNTER — Other Ambulatory Visit (HOSPITAL_COMMUNITY): Payer: Self-pay

## 2023-07-24 ENCOUNTER — Other Ambulatory Visit (HOSPITAL_BASED_OUTPATIENT_CLINIC_OR_DEPARTMENT_OTHER): Payer: Self-pay

## 2023-07-25 ENCOUNTER — Other Ambulatory Visit (HOSPITAL_COMMUNITY): Payer: Self-pay

## 2023-07-26 ENCOUNTER — Other Ambulatory Visit (HOSPITAL_COMMUNITY): Payer: Self-pay

## 2023-07-28 ENCOUNTER — Other Ambulatory Visit (HOSPITAL_COMMUNITY): Payer: Self-pay

## 2023-07-29 ENCOUNTER — Other Ambulatory Visit (HOSPITAL_COMMUNITY): Payer: Self-pay

## 2023-08-26 ENCOUNTER — Other Ambulatory Visit (HOSPITAL_COMMUNITY): Payer: Self-pay

## 2023-08-26 ENCOUNTER — Other Ambulatory Visit: Payer: Self-pay

## 2023-08-26 MED ORDER — METHOCARBAMOL 500 MG PO TABS
500.0000 mg | ORAL_TABLET | ORAL | 0 refills | Status: DC | PRN
  Filled 2023-08-26: qty 30, 5d supply, fill #0

## 2023-08-27 ENCOUNTER — Other Ambulatory Visit: Payer: Self-pay

## 2023-09-26 ENCOUNTER — Other Ambulatory Visit (HOSPITAL_COMMUNITY): Payer: Self-pay

## 2023-09-26 ENCOUNTER — Other Ambulatory Visit: Payer: Self-pay

## 2023-09-29 ENCOUNTER — Other Ambulatory Visit (HOSPITAL_COMMUNITY): Payer: Self-pay

## 2023-09-29 MED ORDER — ALPRAZOLAM 0.5 MG PO TABS
0.5000 mg | ORAL_TABLET | Freq: Every evening | ORAL | 5 refills | Status: DC | PRN
  Filled 2023-10-25: qty 30, 30d supply, fill #0
  Filled 2023-11-23: qty 30, 30d supply, fill #1
  Filled 2023-12-23: qty 30, 30d supply, fill #2
  Filled 2024-01-24 – 2024-01-28 (×2): qty 30, 30d supply, fill #3
  Filled 2024-03-09: qty 30, 30d supply, fill #4

## 2023-09-29 MED ORDER — CITALOPRAM HYDROBROMIDE 20 MG PO TABS
20.0000 mg | ORAL_TABLET | Freq: Every day | ORAL | 1 refills | Status: DC
  Filled 2023-10-17: qty 90, 90d supply, fill #0
  Filled 2024-01-24 – 2024-01-28 (×2): qty 90, 90d supply, fill #1

## 2023-09-29 MED ORDER — ZOLPIDEM TARTRATE ER 12.5 MG PO TBCR
12.5000 mg | EXTENDED_RELEASE_TABLET | Freq: Every evening | ORAL | 0 refills | Status: AC | PRN
  Filled 2023-09-29: qty 30, 30d supply, fill #0
  Filled 2023-10-25: qty 15, 15d supply, fill #0
  Filled 2023-12-23: qty 15, 15d supply, fill #1

## 2023-10-17 ENCOUNTER — Other Ambulatory Visit (HOSPITAL_COMMUNITY): Payer: Self-pay

## 2023-10-25 ENCOUNTER — Other Ambulatory Visit (HOSPITAL_COMMUNITY): Payer: Self-pay

## 2023-10-25 ENCOUNTER — Other Ambulatory Visit (HOSPITAL_BASED_OUTPATIENT_CLINIC_OR_DEPARTMENT_OTHER): Payer: Self-pay

## 2023-10-27 ENCOUNTER — Other Ambulatory Visit: Payer: Self-pay

## 2023-11-01 ENCOUNTER — Other Ambulatory Visit (HOSPITAL_COMMUNITY): Payer: Self-pay

## 2023-11-23 ENCOUNTER — Other Ambulatory Visit (HOSPITAL_COMMUNITY): Payer: Self-pay

## 2023-11-24 ENCOUNTER — Other Ambulatory Visit: Payer: Self-pay

## 2023-11-24 ENCOUNTER — Other Ambulatory Visit (HOSPITAL_COMMUNITY): Payer: Self-pay

## 2023-12-23 ENCOUNTER — Other Ambulatory Visit (HOSPITAL_COMMUNITY): Payer: Self-pay

## 2023-12-23 ENCOUNTER — Other Ambulatory Visit: Payer: Self-pay

## 2024-01-26 ENCOUNTER — Other Ambulatory Visit (HOSPITAL_COMMUNITY): Payer: Self-pay

## 2024-01-27 ENCOUNTER — Other Ambulatory Visit: Payer: Self-pay

## 2024-01-27 ENCOUNTER — Encounter: Payer: Self-pay | Admitting: Pharmacist

## 2024-01-28 ENCOUNTER — Other Ambulatory Visit (HOSPITAL_COMMUNITY): Payer: Self-pay

## 2024-01-28 ENCOUNTER — Other Ambulatory Visit: Payer: Self-pay

## 2024-01-29 ENCOUNTER — Encounter: Payer: Self-pay | Admitting: Cardiovascular Disease

## 2024-01-29 ENCOUNTER — Ambulatory Visit: Payer: BC Managed Care – PPO | Attending: Cardiovascular Disease | Admitting: Cardiovascular Disease

## 2024-01-29 VITALS — BP 154/78 | HR 63 | Ht 68.0 in | Wt 196.2 lb

## 2024-01-29 DIAGNOSIS — I7121 Aneurysm of the ascending aorta, without rupture: Secondary | ICD-10-CM | POA: Diagnosis not present

## 2024-01-29 DIAGNOSIS — I10 Essential (primary) hypertension: Secondary | ICD-10-CM | POA: Diagnosis not present

## 2024-01-29 DIAGNOSIS — I2510 Atherosclerotic heart disease of native coronary artery without angina pectoris: Secondary | ICD-10-CM | POA: Diagnosis not present

## 2024-01-29 NOTE — Patient Instructions (Signed)
 Medication Instructions:  No changes *If you need a refill on your cardiac medications before your next appointment, please call your pharmacy*  Lab Work: none   Testing/Procedures: none  Follow-Up: At [facility], you and your health needs are our priority.  As part of our continuing mission to provide you with exceptional heart care, our providers are all part of one team.  This team includes your primary Cardiologist (physician) and Advanced Practice Providers or APPs (Physician Assistants and Nurse Practitioners) who all work together to provide you with the care you need, when you need it.  Your next appointment:   12 month(s)  Provider:   Antoinette Batman, MD

## 2024-01-29 NOTE — Progress Notes (Signed)
 Chief Complaint  Patient presents with   Follow-up    CAD   History of Present Illness: 63 yo Charlotte [name] with history of HTN, HLD, GERD, anxiety, thoracic aortic aneurysm and CAD who is here today for follow up. She has been followed in our office by Dr. Ardell Beauvais and Dr. Felipe Horton. She had a CT coronary calcium  score of 41 in February 2023. Ascending aorta 4.2 cm by non-contrast CT in 2023. Echo August 2024 with LVEF=65-70%. No valve disease. The aortic root did not appear to be enlarged. She has not tolerated statins. She stopped her Zetia  and does not wish to restart. She does not wish to consider Repatha.   She is here today for follow up. The patient denies any chest pain, dyspnea, palpitations, lower extremity edema, orthopnea, PND, dizziness, near syncope or syncope.   Primary Care Physician: Rae Bugler, MD   Past Medical History:  Diagnosis Date   Anxiety    Arthritis    Elevated LDL cholesterol level    Foot pain    GERD (gastroesophageal reflux disease)    Hyperlipidemia    Hypertension    Insomnia    Leukocytoclastic vasculitis (HCC)    Neck pain     Past Surgical History:  Procedure Laterality Date   ANTERIOR CERVICAL DECOMP/DISCECTOMY FUSION N/A 07/06/2013   Procedure: ANTERIOR CERVICAL DECOMPRESSION/DISCECTOMY FUSION 2 LEVEL/HARDWARE REMOVAL;  Surgeon: Manya Sells, MD;  Location: [facility];  Service: Neurosurgery;  Laterality: N/A;  C3-4 Anterior cervical decompression/diskectomy/fusion/removal of hardware at C6 with revision of Anterior cervical fusion at C5-6   ARTHRODESIS METATARSALPHALANGEAL JOINT (MTPJ) Left 08/02/2021   Procedure: Left hallux metatarsophalangeal joint arthrodesis;  Surgeon: Amada Backer, MD;  Location: [facility];  Service: Orthopedics;  Laterality: Left;   BREAST SURGERY     right side   CARPAL TUNNEL RELEASE     bilateral   CERVICAL SPINE SURGERY     2004   ENDOMETRIAL ABLATION     EYE SURGERY     FINGER SURGERY      left 5th finger   MOUTH SURGERY     1970's   WEIL OSTEOTOMY Left 08/02/2021   Procedure: 2-3 Weil osteotomies; lateral collateral ligament repairs;  Surgeon: Amada Backer, MD;  Location: [facility];  Service: Orthopedics;  Laterality: Left;    Current Outpatient Medications  Medication Sig Dispense Refill   ALPRAZolam  (XANAX ) 0.5 MG tablet Take 1 tablet (0.5 mg total) by mouth at bedtime as needed. 30 tablet 5   Cholecalciferol (VITAMIN D) 50 MCG (2000 UT) tablet Take 1 tablet by mouth daily.     CINNAMON PO Take 1,200 mg by mouth daily.     citalopram  (CELEXA ) 20 MG tablet Take 1 tablet (20 mg total) by mouth daily. 90 tablet 1   estradiol  (DOTTI ) 0.1 MG/24HR patch Place 1 patch (0.1 mg total) onto the skin 2 (two) times a week. 24 patch 3   methocarbamol  (ROBAXIN ) 500 MG tablet Take 1 tablet (500 mg total) by mouth every 4 (four) hours as needed. 30 tablet 0   Misc Natural Products (TURMERIC CURCUMIN) CAPS Take 1 capsule by mouth daily.     pantoprazole  (PROTONIX ) 40 MG tablet Take 1 tablet (40 mg total) by mouth daily. 90 tablet 0   progesterone  (PROMETRIUM ) 200 MG capsule TAKE 1 CAPSULE BY MOUTH AT BEDTIME 30 capsule 1   zolpidem  (AMBIEN  CR) 12.5 MG CR tablet Take 1 tablet (12.5 mg total) by mouth at bedtime  as needed. 30 tablet 1   ALPRAZolam  (XANAX ) 0.5 MG tablet Take 1 tablet (0.5 mg total) by mouth at bedtime as needed. 30 tablet 5   ascorbic Acid (VITAMIN C) 500 MG CPCR Take 500 mg by mouth daily.     B Complex CAPS 1 capsule.     chlorthalidone  (HYGROTON ) 25 MG tablet Take 1/2 tablet (12.5 mg total) by mouth daily. 45 tablet 3   estradiol  (DOTTI ) 0.1 MG/24HR patch APPLY 1 PATCH ON THE SKIN TWICE WEEKLY 24 patch 3   ezetimibe  (ZETIA ) 10 MG tablet Take 1 tablet (10 mg total) by mouth daily. (Patient not taking: Reported on 01/29/2024) 90 tablet 3   progesterone  (PROMETRIUM ) 200 MG capsule Take 1 capsule (200 mg total) by mouth at bedtime. 90 capsule 3   zolpidem   (AMBIEN  CR) 12.5 MG CR tablet Take 1 tablet (12.5 mg total) by mouth at bedtime as needed. 30 tablet 0   zolpidem  (AMBIEN  CR) 12.5 MG CR tablet Take 1 tablet (12.5 mg total) by mouth at bedtime as needed. 30 tablet 0   zolpidem  (AMBIEN  CR) 12.5 MG CR tablet Take 1 tablet (12.5 mg total) by mouth at bedtime as needed. 30 tablet 0   No current facility-administered medications for this visit.    Allergies  Allergen Reactions   Hctz [Hydrochlorothiazide]     INEFFECTIVE FOR BP   Lisinopril Other (See Comments)    REACTION: cough   Pravastatin  Sodium     Joint Pain   Pregabalin     Other reaction(s): irritable   Statins     CONSTIPATION    Telmisartan      Other reaction(s): chest tightness    Social History   Socioeconomic History   Marital status: Married    Spouse name: Not on file   Number of children: Not on file   Years of education: Not on file   Highest education level: Not on file  Occupational History   Not on file  Tobacco Use   Smoking status: Never   Smokeless tobacco: Never  Substance and Sexual Activity   Alcohol use: Yes    Comment: social   Drug use: No   Sexual activity: Not on file  Other Topics Concern   Not on file  Social History Narrative   Not on file   Social Drivers of Health   Financial Resource Strain: Not on file  Food Insecurity: Not on file  Transportation Needs: Not on file  Physical Activity: Not on file  Stress: Not on file  Social Connections: Not on file  Intimate Partner Violence: Not on file    Family History  Problem Relation Age of Onset   Hypertension Mother    Stroke Mother    Hyperthyroidism Mother    Atrial fibrillation Mother    Diabetes Father    Hypertension Father    Heart disease Father        CABG   CAD Brother    Hypothyroidism Brother    Cancer Maternal Grandmother     Review of Systems:  As stated in the HPI and otherwise negative.   BP (!) 154/78 (BP Location: Right Arm, Patient Position:  Sitting, Cuff Size: Normal)   Pulse 63   Ht 5' 8 (1.727 m)   Wt 196 lb 3.2 oz (89 kg)   SpO2 98%   BMI 29.83 kg/m   Physical Examination: General: Well developed, well nourished, NAD  HEENT: OP clear, mucus membranes moist  SKIN: warm, dry.  No rashes. Neuro: No focal deficits  Musculoskeletal: Muscle strength 5/5 all ext  Psychiatric: Mood and affect normal  Neck: No JVD, no carotid bruits, no thyromegaly, no lymphadenopathy.  Lungs:Clear bilaterally, no wheezes, rhonci, crackles Cardiovascular: Regular rate and rhythm. No murmurs, gallops or rubs. Abdomen:Soft. Bowel sounds present. Non-tender.  Extremities: No lower extremity edema. Pulses are 2 + in the bilateral DP/PT.  EKG:  EKG is ordered today. The ekg ordered today demonstrates    Recent Labs: No results found for requested labs within last 365 days.   Lipid Panel    Component Value Date/Time   CHOL 271 (H) 03/27/2023 0820   TRIG 86 03/27/2023 0820   HDL Charlotte 03/27/2023 0820   CHOLHDL 4.0 03/27/2023 0820   LDLCALC 190 (H) 03/27/2023 0820     Wt Readings from Last 3 Encounters:  01/29/24 196 lb 3.2 oz (89 kg)  01/29/23 178 lb 3.2 oz (80.8 kg)  06/04/22 193 lb 3.2 oz (87.6 kg)    Assessment and Plan:   1. CAD without angina: She has an abnormal coronary calcium  score in 2023. She feels well overall. She does not have chest pain. She does not tolerate statins and stopped taking Zetia . LDL 190 in August 2024. She does not wish to start Repatha.   2. Thoracic aortic aneurysm: 4.2 cm by non-contrast CT in February 2023. Echo August 2024 with normal appearing aortic root and ascending aorta. She does not wish to arrange a chest CTA now.    3. HTN: BP is well controlled at home. She is under much stress at home and believes her blood pressure is elevated today because of this. She is no longer taking chlorthalidone .   Labs/ tests ordered today include:   Orders Placed This Encounter  Procedures   EKG 12-Lead    Disposition:   F/U with me in 12 months   Signed, Antoinette Batman, MD, [facility] 01/29/2024 10:43 AM    [facility] [address] [place], [place], [place]  [address] Phone: [phone]; Fax: [phone]

## 2024-03-09 ENCOUNTER — Other Ambulatory Visit: Payer: Self-pay

## 2024-03-09 ENCOUNTER — Other Ambulatory Visit (HOSPITAL_COMMUNITY): Payer: Self-pay

## 2024-04-20 ENCOUNTER — Other Ambulatory Visit (HOSPITAL_COMMUNITY): Payer: Self-pay

## 2024-04-22 ENCOUNTER — Encounter (HOSPITAL_COMMUNITY): Payer: Self-pay

## 2024-04-22 ENCOUNTER — Other Ambulatory Visit (HOSPITAL_COMMUNITY): Payer: Self-pay

## 2024-04-23 ENCOUNTER — Other Ambulatory Visit (HOSPITAL_COMMUNITY): Payer: Self-pay

## 2024-04-23 MED ORDER — CITALOPRAM HYDROBROMIDE 20 MG PO TABS
20.0000 mg | ORAL_TABLET | Freq: Every day | ORAL | 1 refills | Status: AC
  Filled 2024-04-23: qty 90, 90d supply, fill #0
  Filled 2024-07-20: qty 90, 90d supply, fill #1

## 2024-04-23 MED ORDER — ZOLPIDEM TARTRATE ER 12.5 MG PO TBCR
12.5000 mg | EXTENDED_RELEASE_TABLET | Freq: Every evening | ORAL | 0 refills | Status: DC | PRN
  Filled 2024-04-23: qty 30, 30d supply, fill #0

## 2024-04-23 MED ORDER — ALPRAZOLAM 0.5 MG PO TABS
0.5000 mg | ORAL_TABLET | Freq: Every evening | ORAL | 5 refills | Status: DC | PRN
  Filled 2024-04-23: qty 30, 30d supply, fill #0
  Filled 2024-05-27 – 2024-06-09 (×3): qty 30, 30d supply, fill #1

## 2024-04-23 MED ORDER — PANTOPRAZOLE SODIUM 40 MG PO TBEC
40.0000 mg | DELAYED_RELEASE_TABLET | Freq: Every morning | ORAL | 0 refills | Status: DC
  Filled 2024-04-23: qty 90, 90d supply, fill #0

## 2024-05-17 ENCOUNTER — Other Ambulatory Visit (HOSPITAL_COMMUNITY): Payer: Self-pay

## 2024-05-17 MED ORDER — BUSPIRONE HCL 5 MG PO TABS
5.0000 mg | ORAL_TABLET | Freq: Two times a day (BID) | ORAL | 0 refills | Status: DC
  Filled 2024-05-17: qty 60, 30d supply, fill #0

## 2024-05-24 ENCOUNTER — Ambulatory Visit: Attending: Cardiology | Admitting: Cardiology

## 2024-05-24 ENCOUNTER — Other Ambulatory Visit (HOSPITAL_COMMUNITY): Payer: Self-pay

## 2024-05-24 ENCOUNTER — Encounter: Payer: Self-pay | Admitting: Cardiovascular Disease

## 2024-05-24 ENCOUNTER — Telehealth: Payer: Self-pay | Admitting: Cardiovascular Disease

## 2024-05-24 VITALS — BP 162/100 | HR 65 | Ht 68.0 in | Wt 199.0 lb

## 2024-05-24 DIAGNOSIS — I77810 Thoracic aortic ectasia: Secondary | ICD-10-CM | POA: Diagnosis not present

## 2024-05-24 DIAGNOSIS — R931 Abnormal findings on diagnostic imaging of heart and coronary circulation: Secondary | ICD-10-CM | POA: Diagnosis not present

## 2024-05-24 DIAGNOSIS — E7800 Pure hypercholesterolemia, unspecified: Secondary | ICD-10-CM | POA: Diagnosis not present

## 2024-05-24 DIAGNOSIS — I10 Essential (primary) hypertension: Secondary | ICD-10-CM | POA: Diagnosis not present

## 2024-05-24 MED ORDER — CHLORTHALIDONE 25 MG PO TABS
12.5000 mg | ORAL_TABLET | Freq: Every day | ORAL | 3 refills | Status: DC
  Filled 2024-05-24: qty 45, 90d supply, fill #0
  Filled 2024-07-20 – 2024-07-31 (×2): qty 45, 90d supply, fill #1

## 2024-05-24 MED ORDER — LOSARTAN POTASSIUM 50 MG PO TABS
50.0000 mg | ORAL_TABLET | Freq: Every evening | ORAL | 3 refills | Status: DC
  Filled 2024-05-24: qty 90, 90d supply, fill #0

## 2024-05-24 NOTE — Telephone Encounter (Signed)
 Called patient about message. Patient stated her BP is extremely elevated. Patient has a history of HTN in the past. Patient stated that Saturday a week ago she had SOB with walking up her stairs and she has been having chest tightness off and on for the last couple of weeks. Patient also had some blurriness in her right eye. Patient saw her PCP, and he just put her on anxiety medication. Patient stated that was not helping. Patient stated she would like to start taking something for her BP. In the past she has tried several medications Metoprolol , amlodipine  losartan , and telmisartan . Most of these are on her allergy list, but she would like to possibly try losartan  again. The reason she is not on anything now, is due to being able to control BP through holistic care, such as exercising, eating healthy, reducing stress, and just self care. Now patient has cared for her husband before his passing in July with cancer. This has added stress and patient has done less self care. Her last BP reading was 148/101 HR 68. Patient stated her HR sometimes gets as low as 50's. Made patient an appointment with DOD, Dr. Michele to get evaluated. Patient stated she did not feel like she needed to go to the ED, but will go if she feels like she needs to go.

## 2024-05-24 NOTE — Telephone Encounter (Signed)
  Pt c/o BP issue: STAT if pt c/o blurred vision, one-sided weakness or slurred speech.  STAT if BP is GREATER than 180/120 TODAY.  STAT if BP is LESS than 90/60 and SYMPTOMATIC TODAY  1. What is your BP concern? Elevated BP   2. Have you taken any BP medication today?not currently on BP meds   3. What are your last 5 BP readings?158/107(today), 152/981 163/105, 153/105, 162/105  4. Are you having any other symptoms (ex. Dizziness, headache, blurred vision, passed out)? Dizziness, some blurred vision and SOB when going upstairs   Patient said her husband passed away this 03/15/24 and her pcp gave her prescription for anxiety but it did not help and BP been elevated

## 2024-05-24 NOTE — Progress Notes (Signed)
 Cardiology Office Note:  .   Date:  05/29/2024  ID:  Charlotte [name], DOB Apr 25, 1961, MRN [id number] PCP:  Seabron Lenis, MD  [facility] Providers Cardiologist:  Lonni Cash, MD  Electrophysiologist:  None  Click to update primary MD,subspecialty MD or APP then REFRESH:1}    Chief Complaint  Patient presents with   Follow-up    DOD sick visit - elevated BP     History of Present Illness: .   Charlotte [name] is a 63 y.o. Caucasian female whose past medical history and cardiovascular risk factors includes: Hypertension, hyperlipidemia, GERD, anxiety, thoracic aortic aneurysm, CAD, mild coronary artery calcification.  In February 2023 patient had a coronary calcium  score and was noted to be 41 as well as an incidental finding of a ascending aorta at 42 mm.  Echocardiogram in August 2024 noted preserved LVEF with no significant valvular heart disease.  Aortic root did not appear to be enlarged.  According to the EMR patient did not tolerate statin therapy.  She stopped her Zetia  as well as did not want to restart it.  Patient follows up with Dr. Cash on a regular basis and I am seeing her today for sick visit for blood pressure management.  Patient called triage line earlier this morning requesting to be seen today due to elevated blood pressures.  At home blood pressures were recorded to be 148/101 mmHg with a pulse of 68 bpm.    Patient has experienced significant stress due to the death of her husband and dog.She has been dealing with anxiety and is on medication for it. She has a history of hypertension and has been managing it with medication. She is physically active, walking 2-3 miles but does not experience exertional symptoms.  However, when she goes up a flight of stairs she experiences dyspnea as well as bendopnea.  Along with elevated blood pressures patient has been experiencing pulsation in her right eye, no change in vision, no nausea vomiting, no  severe headaches, and no change in ambulation.  Patient states that she is aware of stroke symptoms and if present she would go to the closest ER via EMS.  Patient was on blood pressure medication in the past but does have side effects.  Telmisartan  caused chest pain, metoprolol  affecting her overall affect.  She was able to tolerate losartan .  Review of Systems: .   Review of Systems  Cardiovascular:  Negative for chest pain, claudication, irregular heartbeat, leg swelling, near-syncope, orthopnea, palpitations, paroxysmal nocturnal dyspnea and syncope.  Respiratory:  Negative for shortness of breath.   Hematologic/Lymphatic: Negative for bleeding problem.    Studies Reviewed:   EKG: EKG Interpretation Date/Time:  Monday May 24 2024 13:35:41 EDT Ventricular Rate:  65 PR Interval:  182 QRS Duration:  114 QT Interval:  412 QTC Calculation: 428 R Axis:   -58  Text Interpretation: Normal sinus rhythm Incomplete right bundle branch block Left anterior fascicular block Minimal voltage criteria for LVH, may be normal variant ( Cornell product ) When compared with ECG of 29-Jan-2024 09:26, T wave amplitude has increased in Anterior leads Confirmed by Michele Richardson [phone]) on 05/24/2024 1:46:36 PM  Echocardiogram: 03/2023 LVEF 65 to 70%. Right ventricular size and function normal. Trivial MR Estimated RAP 8 mmHg. Aortic root 33 mm, ascending aorta 39 mm, reported to be normal when indexed for body surface area  Coronary calcium  score 10/08/2021 1. Coronary calcium  score of 41 is at the 80th percentile for the patient's age,  sex and race. 2. Aneurysmal dilatation of the ascending thoracic aorta measuring up to 4.2 cm in estimated maximal diameter.  3.  See report for additional details  RADIOLOGY: N/A  Risk Assessment/Calculations:   NA   Labs:    CBC with Diff Reviewed date:05/17/2024 04:41:31 PM Interpretation:Normal Performing Lab: Notes/Report: Testing Performed at:  [facility], [address], [address], [place], [place] [address]  WBC 6.1 4.0-11.0 K/ul    RBC 4.74 4.20-5.40 M/uL    HGB 13.5 12.0-16.0 g/dL    HCT 60.3 62.9-52.9 %    MCV 83.6 81.0-99.0 fL    MCH 28.4 27.0-33.0 pg    MCHC 34.0 32.0-36.0 g/dL    RDW 86.2 88.4-84.4 %    PLT 271 150-400 K/uL    MPV 9.4 7.5-10.7 fL    NE% 58.7 43.3-71.9 %    LY% 33.4 16.8-43.5 %    MO% 5.6 4.6-12.4 %    EO% 1.4 0.0-7.8 %    BA% 0.9 0.0-1.0 %    NE# 3.6 1.9-7.2 K/uL    LY# 2.00 1.10-2.70 K/uL    MO# 0.3 0.3-0.8 K/uL    EO# 0.1 0.0-0.6 K/uL    BA# 0.1 0.0-0.1 K/uL    NRBC% 0.30      NRBC# 0.02      Comp Metabolic Panel Reviewed date:05/17/2024 05:02:06 PM Interpretation:Normal Performing Lab: Notes/Report: Testing Performed at: [facility], [address], [address], [place], [place] [address]  Glucose 81 70-99 mg/dL    BUN 17 3-73 mg/dL    Creatinine 9.19 9.39-8.69 mg/dl    [id number] 82 >39 calc Stage 1 > 90 ML/Min plus Albuminuria;Stage 2 60-89 ML/MIN;Stage 3 30-59 ML/MIN;Stage 4 15-29 ML/MIN;Stage 5 <15 ML/MIN  Sodium 137 136-145 mmol/L    Potassium 4.6 3.5-5.5 mmol/L    Chloride 103 98-107 mmol/L    CO2 29 22-32 mmol/L    Anion Gap 10.4 6.0-20.0 mmol/L    Calcium  9.8 8.6-10.3 mg/dL    CA-corrected 0.80 1.39-89.69 mg/dL    Protein, Total 7.5 3.9-1.6 g/dL    Albumin 4.7 6.5-5.1 g/dL    TBIL 0.9 9.6-8.9 mg/dL    ALP 62 61-873 U/L    AST 19 0-39 U/L    ALT 24 0-52 U/L     Physical Exam:    Today's Vitals   05/24/24 1338  BP: (!) 162/100  Pulse: 65  SpO2: 95%  Weight: 199 lb (90.3 kg)  Height: 5' 8 (1.727 m)   Body mass index is 30.26 kg/m. Wt Readings from Last 3 Encounters:  05/24/24 199 lb (90.3 kg)  01/29/24 196 lb 3.2 oz (89 kg)  01/29/23 178 lb 3.2 oz (80.8 kg)    Physical Exam  Constitutional: No distress.  hemodynamically stable  Neck: No JVD present.  Cardiovascular: Normal rate, regular rhythm, S1 normal and S2 normal. Exam reveals no gallop, no S3 and no  S4.  No murmur heard. Pulmonary/Chest: Effort normal and breath sounds normal. No stridor. She has no wheezes. She has no rales.  Musculoskeletal:        General: No edema.     Cervical back: Neck supple.  Skin: Skin is warm.     Impression & Recommendation(s):  Impression:   ICD-10-CM   1. Essential hypertension  I10 EKG 12-Lead    AMB Referral to [facility]    ECHOCARDIOGRAM COMPLETE    2. Pure hypercholesterolemia  E78.00     3. Agatston coronary artery calcium  score less than 100  R93.1 ECHOCARDIOGRAM  COMPLETE    4. Ascending aorta dilatation  I77.810        Recommendation(s):  Essential hypertension Patient endorses elevated blood pressures in the recent past. Likely secondary to increased stress after the loss of her husband and dog Home blood pressures recently have been as high as 160/108 She experiences shortness of breath with effort related to activity such as going up a flight of stairs but feels asymptomatic when she walks 2 to 3 miles as part of her daily routine Telmisartan  caused chest pain in the past and Toprol -XL caused decreased affect Patient is willing to reconsider losartan , start losartan  50 mg p.o. every afternoon Start chlorthalidone  12.5 mg p.o. every morning BMP in one week to check renal function and electrolytes Will refer to Pharm.D. clinic for blood pressure management Plan echocardiogram in November 2025 to reevaluate LVEF and dimensions of the ascending aorta  Pure hypercholesterolemia Continue Zetia  10 mg p.o. daily Cardiology following peripherally, managed by primary care provider.  Agatston coronary artery calcium  score less than 100 CAC score February 2023: 41, 80th percentile Currently on Zetia . Patient does not endorse anginal discomfort. Her effort related dyspnea could be secondary to uncontrolled hypertension. If symptoms worsen in intensity frequency or duration patient is advised to seek sooner attention Will  reevaluate at the next visit and if still present will consider ischemic evaluation.   Ascending aorta dilatation CAC score February 2023: 42 mm, ascending aorta Echo August 2024: 39 mm, ascending aorta Echocardiogram November 2025 to reevaluate LVEF and aortic dimensions   Orders Placed:  Orders Placed This Encounter  Procedures   AMB Referral to [facility] Pharm-D    Referral Priority:   Routine    Referral Type:   Consultation    Referral Reason:   Specialty Services Required    Number of Visits Requested:   1   EKG 12-Lead   ECHOCARDIOGRAM COMPLETE    Standing Status:   Future    Expected Date:   05/31/2024    Expiration Date:   05/24/2025    Where should this test be performed[facility]    Does the patient weigh less than or greater than 250 lbs?:   Patient weighs less than 250 lbs    Perflutren DEFINITY (image enhancing agent) should be administered unless hypersensitivity or allergy exist:   Administer Perflutren    Reason for exam-Echo:   CAD Native Vessel  I25.10     Final Medication List:    Meds ordered this encounter  Medications   losartan  (COZAAR ) 50 MG tablet    Sig: Take 1 tablet (50 mg total) by mouth every evening.    Dispense:  90 tablet    Refill:  3   chlorthalidone  (HYGROTON ) 25 MG tablet    Sig: Take 0.5 tablets (12.5 mg total) by mouth daily.    Dispense:  45 tablet    Refill:  3    Medications Discontinued During This Encounter  Medication Reason   chlorthalidone  (HYGROTON ) 25 MG tablet Reorder     Current Outpatient Medications:    ALPRAZolam  (XANAX ) 0.5 MG tablet, Take 1 tablet (0.5 mg total) by mouth at bedtime as needed., Disp: 30 tablet, Rfl: 5   ALPRAZolam  (XANAX ) 0.5 MG tablet, Take 1 tablet (0.5 mg total) by mouth at bedtime as needed., Disp: 30 tablet, Rfl: 5   ascorbic Acid (VITAMIN C) 500 MG CPCR, Take 500 mg by mouth daily., Disp: , Rfl:    B Complex  CAPS, 1 capsule., Disp: , Rfl:    busPIRone (BUSPAR) 5 MG tablet,  Take 1 tablet (5 mg total) by mouth 2 (two) times daily., Disp: 60 tablet, Rfl: 0   Cholecalciferol (VITAMIN D) 50 MCG (2000 UT) tablet, Take 1 tablet by mouth daily., Disp: , Rfl:    CINNAMON PO, Take 1,200 mg by mouth daily., Disp: , Rfl:    citalopram  (CELEXA ) 20 MG tablet, Take 1 tablet (20 mg total) by mouth daily., Disp: 90 tablet, Rfl: 1   estradiol  (DOTTI ) 0.1 MG/24HR patch, APPLY 1 PATCH ON THE SKIN TWICE WEEKLY, Disp: 24 patch, Rfl: 3   estradiol  (DOTTI ) 0.1 MG/24HR patch, Place 1 patch (0.1 mg total) onto the skin 2 (two) times a week., Disp: 24 patch, Rfl: 3   losartan  (COZAAR ) 50 MG tablet, Take 1 tablet (50 mg total) by mouth every evening., Disp: 90 tablet, Rfl: 3   methocarbamol  (ROBAXIN ) 500 MG tablet, Take 1 tablet (500 mg total) by mouth every 4 (four) hours as needed., Disp: 30 tablet, Rfl: 0   Misc Natural Products (TURMERIC CURCUMIN) CAPS, Take 1 capsule by mouth daily., Disp: , Rfl:    pantoprazole  (PROTONIX ) 40 MG tablet, Take 1 tablet (40 mg total) by mouth daily., Disp: 90 tablet, Rfl: 0   pantoprazole  (PROTONIX ) 40 MG tablet, Take 1 tablet (40 mg total) by mouth every morning 30 minutes to 1 hour before morning meal., Disp: 90 tablet, Rfl: 0   progesterone  (PROMETRIUM ) 200 MG capsule, Take 1 capsule (200 mg total) by mouth at bedtime., Disp: 90 capsule, Rfl: 3   zolpidem  (AMBIEN  CR) 12.5 MG CR tablet, Take 1 tablet (12.5 mg total) by mouth at bedtime as needed., Disp: 30 tablet, Rfl: 1   zolpidem  (AMBIEN  CR) 12.5 MG CR tablet, Take 1 tablet (12.5 mg total) by mouth at bedtime as needed., Disp: 30 tablet, Rfl: 0   zolpidem  (AMBIEN  CR) 12.5 MG CR tablet, Take 1 tablet (12.5 mg total) by mouth at bedtime as needed., Disp: 30 tablet, Rfl: 0   zolpidem  (AMBIEN  CR) 12.5 MG CR tablet, Take 1 tablet (12.5 mg total) by mouth at bedtime as needed., Disp: 30 tablet, Rfl: 0   zolpidem  (AMBIEN  CR) 12.5 MG CR tablet, Take 1 tablet (12.5 mg total) by mouth at bedtime as needed., Disp: 30  tablet, Rfl: 0   chlorthalidone  (HYGROTON ) 25 MG tablet, Take 0.5 tablets (12.5 mg total) by mouth daily., Disp: 45 tablet, Rfl: 3   ezetimibe  (ZETIA ) 10 MG tablet, Take 1 tablet (10 mg total) by mouth daily. (Patient not taking: Reported on 05/24/2024), Disp: 90 tablet, Rfl: 3   progesterone  (PROMETRIUM ) 200 MG capsule, TAKE 1 CAPSULE BY MOUTH AT BEDTIME (Patient not taking: Reported on 05/24/2024), Disp: 30 capsule, Rfl: 1  Consent:   NA  Disposition:   6-8 weeks with primary cardiologist  Her questions and concerns were addressed to her satisfaction. She voices understanding of the recommendations provided during this encounter.    Signed, Madonna Michele HAS, [facility]  A Division of [facility] [address] [place], [place] [address]

## 2024-05-24 NOTE — Telephone Encounter (Signed)
 Called patient about message. Patient stated her BP is extremely elevated. Patient has a history of HTN in the past. Patient stated that Saturday a week ago she had SOB with walking up her stairs and she has been having chest tightness off and on for the last couple of weeks. Patient also had some blurriness in her right eye. Patient saw her PCP, and he just put her on anxiety medication. Patient stated that was not helping. Patient stated she would like to start taking something for her BP.  In the past she has tried several medications Metoprolol , amlodipine  losartan , and telmisartan . Most of these are on her allergy list, but she would like to possibly try losartan  again. The reason she is not on anything now, is due to being able to control BP through holistic care, such as exercising, eating healthy, reducing stress, and just self care. Now patient has cared for her husband before his passing in July with cancer. This has added stress and patient has done less self care. Her last BP reading was 148/101 HR 68. Patient stated her HR sometimes gets as low as 50's. Made patient an appointment with DOD, Dr. Michele to get evaluated. Patient does not feel like she needs to go to the ED, but will go if she thinks she is having a stroke.

## 2024-05-24 NOTE — Patient Instructions (Signed)
 Medication Instructions:  Please start Losartan  50 mg every night at bedtime. Restart Chlorthalidone  25 mg 1/2 tablet every morning. Continue all other medications as listed.  *If you need a refill on your cardiac medications before your next appointment, please call your pharmacy*  Testing/Procedures: Your physician has requested that you have an echocardiogram. Echocardiography is a painless test that uses sound waves to create images of your heart. It provides your doctor with information about the size and shape of your heart and how well your heart's chambers and valves are working. This procedure takes approximately one hour. There are no restrictions for this procedure. Please do NOT wear cologne, perfume, aftershave, or lotions (deodorant is allowed). Please arrive 15 minutes prior to your appointment time.  Please note: We ask at that you not bring children with you during ultrasound (echo/ vascular) testing. Due to room size and safety concerns, children are not allowed in the ultrasound rooms during exams. Our front office staff cannot provide observation of children in our lobby area while testing is being conducted. An adult accompanying a patient to their appointment will only be allowed in the ultrasound room at the discretion of the ultrasound technician under special circumstances. We apologize for any inconvenience.  You have been referred to the Hypertension Clinic with our pharmacy team.  Follow-Up: At [facility], you and your health needs are our priority.  As part of our continuing mission to provide you with exceptional heart care, our providers are all part of one team.  This team includes your primary Cardiologist (physician) and Advanced Practice Providers or APPs (Physician Assistants and Nurse Practitioners) who all work together to provide you with the care you need, when you need it.  Your next appointment:   6 - 8 week(s)  Provider:   Lonni Cash, MD    We recommend signing up for the patient portal called MyChart.  Sign up information is provided on this After Visit Summary.  MyChart is used to connect with patients for Virtual Visits (Telemedicine).  Patients are able to view lab/test results, encounter notes, upcoming appointments, etc.  Non-urgent messages can be sent to your provider as well.   To learn more about what you can do with MyChart, go to [email or web address].

## 2024-05-27 ENCOUNTER — Other Ambulatory Visit: Payer: Self-pay

## 2024-05-27 ENCOUNTER — Other Ambulatory Visit (HOSPITAL_COMMUNITY): Payer: Self-pay

## 2024-05-28 ENCOUNTER — Other Ambulatory Visit (HOSPITAL_COMMUNITY): Payer: Self-pay

## 2024-05-29 ENCOUNTER — Encounter: Payer: Self-pay | Admitting: Cardiology

## 2024-06-01 ENCOUNTER — Other Ambulatory Visit (HOSPITAL_COMMUNITY): Payer: Self-pay

## 2024-06-02 ENCOUNTER — Encounter (HOSPITAL_COMMUNITY): Payer: Self-pay

## 2024-06-02 ENCOUNTER — Other Ambulatory Visit (HOSPITAL_COMMUNITY): Payer: Self-pay

## 2024-06-02 ENCOUNTER — Other Ambulatory Visit: Payer: Self-pay

## 2024-06-03 ENCOUNTER — Other Ambulatory Visit (HOSPITAL_COMMUNITY): Payer: Self-pay

## 2024-06-03 ENCOUNTER — Other Ambulatory Visit: Payer: Self-pay

## 2024-06-03 MED ORDER — BUSPIRONE HCL 5 MG PO TABS
5.0000 mg | ORAL_TABLET | Freq: Two times a day (BID) | ORAL | 0 refills | Status: DC
  Filled 2024-06-03 – 2024-06-09 (×2): qty 60, 30d supply, fill #0

## 2024-06-07 ENCOUNTER — Other Ambulatory Visit (HOSPITAL_COMMUNITY): Payer: Self-pay

## 2024-06-09 ENCOUNTER — Other Ambulatory Visit: Payer: Self-pay

## 2024-06-09 ENCOUNTER — Other Ambulatory Visit (HOSPITAL_COMMUNITY): Payer: Self-pay

## 2024-07-04 NOTE — Progress Notes (Unsigned)
 Patient ID: Charlotte [name]                 DOB: November 01, 1960                      MRN: [id number]      HPI: Charlotte [name] is a 63 y.o. female referred by Dr. Michele to HTN clinic. PMH is significant for HTN, HLD, GERD, anxiety, thoracic aortic aneurysm, CAD, mild coronary calcification.   Saw Dr. Michele in clinic on 05/24/2024 for elevated blood pressure. At this visit she explained that she was under a lot of stress due to the death of her husband and dog. Endorses elevated blood pressure along with pulsation in her right eye. BP at this visit was 162/100 mmHg. Restarted on losartan  and chlorthalidone  at this appointment. Seen in ER on 10/28 for vision changes, stroke ruled out.   Labs 06/17/2024 K 3.6, Scr 0.8, LDL-C 159  Home BP readings? Symptoms? Pulsation in eye? Check BP/pulse, ask about exercise/diet,   Current HTN meds: losartan  50 mg daily, chlorthalidone  12.5 mg daily Previously tried: telmisartan  (chest pain), metoprolol  (changed affect), lisinopril (cough), hydrochlorothiazide (ineffective), amlodipine  (***) BP goal: < 130/80 mmHg  Family History:  Family History  Problem Relation Age of Onset   Hypertension Mother    Stroke Mother    Hyperthyroidism Mother    Atrial fibrillation Mother    Diabetes Father    Hypertension Father    Heart disease Father        CABG   CAD Brother    Hypothyroidism Brother    Cancer Maternal Grandmother     Social History:  Social Drivers of Health   Tobacco Use: Low Risk  (05/29/2024)   Patient History    Smoking Tobacco Use: Never    Smokeless Tobacco Use: Never    Passive Exposure: Not on file  Financial Resource Strain: Not on file  Food Insecurity: Not on file  Transportation Needs: Not on file  Physical Activity: Not on file  Stress: Not on file  Social Connections: Not on file  Depression (EYV7-0): Not on file  Alcohol Screen: Not on file  Housing: Not on file  Utilities: Not on file  Health Literacy: Not on file      Diet:   Exercise:  {types:28256}  Home BP readings:  Date SBP/DBP  HR                              Average      Wt Readings from Last 3 Encounters:  05/24/24 199 lb (90.3 kg)  01/29/24 196 lb 3.2 oz (89 kg)  01/29/23 178 lb 3.2 oz (80.8 kg)   BP Readings from Last 3 Encounters:  05/24/24 (!) 162/100  01/29/24 (!) 154/78  01/29/23 (!) 154/84   Pulse Readings from Last 3 Encounters:  05/24/24 65  01/29/24 63  01/29/23 65    Renal function: CrCl cannot be calculated (Patient's most recent lab result is older than the maximum 21 days allowed.).  Past Medical History:  Diagnosis Date   Anxiety    Arthritis    Elevated LDL cholesterol level    Foot pain    GERD (gastroesophageal reflux disease)    Hyperlipidemia    Hypertension    Insomnia    Leukocytoclastic vasculitis (HCC)    Neck pain     Current Outpatient Medications on File Prior to Visit  Medication Sig Dispense Refill   ALPRAZolam  (XANAX ) 0.5 MG tablet Take 1 tablet (0.5 mg total) by mouth at bedtime as needed. 30 tablet 5   ALPRAZolam  (XANAX ) 0.5 MG tablet Take 1 tablet (0.5 mg total) by mouth at bedtime as needed. 30 tablet 5   ascorbic Acid (VITAMIN C) 500 MG CPCR Take 500 mg by mouth daily.     B Complex CAPS 1 capsule.     busPIRone (BUSPAR) 5 MG tablet Take 1 tablet (5 mg total) by mouth 2 (two) times daily. 60 tablet 0   chlorthalidone  (HYGROTON ) 25 MG tablet Take 0.5 tablets (12.5 mg total) by mouth daily. 45 tablet 3   Cholecalciferol (VITAMIN D) 50 MCG (2000 UT) tablet Take 1 tablet by mouth daily.     CINNAMON PO Take 1,200 mg by mouth daily.     citalopram  (CELEXA ) 20 MG tablet Take 1 tablet (20 mg total) by mouth daily. 90 tablet 1   estradiol  (DOTTI ) 0.1 MG/24HR patch APPLY 1 PATCH ON THE SKIN TWICE WEEKLY 24 patch 3   estradiol  (DOTTI ) 0.1 MG/24HR patch Place 1 patch (0.1 mg total) onto the skin 2 (two) times a week. 24 patch 3   ezetimibe  (ZETIA ) 10 MG tablet Take 1 tablet (10 mg  total) by mouth daily. (Patient not taking: Reported on 05/24/2024) 90 tablet 3   losartan  (COZAAR ) 50 MG tablet Take 1 tablet (50 mg total) by mouth every evening. 90 tablet 3   methocarbamol  (ROBAXIN ) 500 MG tablet Take 1 tablet (500 mg total) by mouth every 4 (four) hours as needed. 30 tablet 0   Misc Natural Products (TURMERIC CURCUMIN) CAPS Take 1 capsule by mouth daily.     pantoprazole  (PROTONIX ) 40 MG tablet Take 1 tablet (40 mg total) by mouth daily. 90 tablet 0   pantoprazole  (PROTONIX ) 40 MG tablet Take 1 tablet (40 mg total) by mouth every morning 30 minutes to 1 hour before morning meal. 90 tablet 0   progesterone  (PROMETRIUM ) 200 MG capsule TAKE 1 CAPSULE BY MOUTH AT BEDTIME (Patient not taking: Reported on 05/24/2024) 30 capsule 1   progesterone  (PROMETRIUM ) 200 MG capsule Take 1 capsule (200 mg total) by mouth at bedtime. 90 capsule 3   zolpidem  (AMBIEN  CR) 12.5 MG CR tablet Take 1 tablet (12.5 mg total) by mouth at bedtime as needed. 30 tablet 1   zolpidem  (AMBIEN  CR) 12.5 MG CR tablet Take 1 tablet (12.5 mg total) by mouth at bedtime as needed. 30 tablet 0   zolpidem  (AMBIEN  CR) 12.5 MG CR tablet Take 1 tablet (12.5 mg total) by mouth at bedtime as needed. 30 tablet 0   zolpidem  (AMBIEN  CR) 12.5 MG CR tablet Take 1 tablet (12.5 mg total) by mouth at bedtime as needed. 30 tablet 0   zolpidem  (AMBIEN  CR) 12.5 MG CR tablet Take 1 tablet (12.5 mg total) by mouth at bedtime as needed. 30 tablet 0   No current facility-administered medications on file prior to visit.    Allergies  Allergen Reactions   Hctz [Hydrochlorothiazide]     INEFFECTIVE FOR BP   Lisinopril Other (See Comments)    REACTION: cough   Pravastatin  Sodium     Joint Pain   Pregabalin     Other reaction(s): irritable   Statins     CONSTIPATION    Telmisartan      Other reaction(s): chest tightness    There were no vitals taken for this visit.   Assessment/Plan: No BP recorded.  {  Refresh Note OR Click  here to enter BP  :1}***   1. Hypertension -  No problem-specific Assessment & Plan notes found for this encounter.      Thank you  Nidia Schaffer, PharmD PGY2 Cardiology Pharmacy Resident

## 2024-07-05 ENCOUNTER — Other Ambulatory Visit: Payer: Self-pay

## 2024-07-05 ENCOUNTER — Ambulatory Visit (HOSPITAL_COMMUNITY)
Admission: RE | Admit: 2024-07-05 | Discharge: 2024-07-05 | Disposition: A | Discharge status: Home / Self Care | Source: Ambulatory Visit | Attending: Cardiology | Admitting: Cardiology

## 2024-07-05 ENCOUNTER — Other Ambulatory Visit (HOSPITAL_BASED_OUTPATIENT_CLINIC_OR_DEPARTMENT_OTHER): Payer: Self-pay

## 2024-07-05 ENCOUNTER — Ambulatory Visit: Attending: Cardiovascular Disease | Admitting: Pharmacist

## 2024-07-05 VITALS — BP 148/92 | HR 77

## 2024-07-05 DIAGNOSIS — R931 Abnormal findings on diagnostic imaging of heart and coronary circulation: Secondary | ICD-10-CM | POA: Insufficient documentation

## 2024-07-05 DIAGNOSIS — I10 Essential (primary) hypertension: Secondary | ICD-10-CM

## 2024-07-05 LAB — ECHOCARDIOGRAM COMPLETE
Area-P 1/2: 3.48 cm2
S' Lateral: 2.3 cm

## 2024-07-05 MED ORDER — LOSARTAN POTASSIUM 50 MG PO TABS
50.0000 mg | ORAL_TABLET | Freq: Two times a day (BID) | ORAL | 2 refills | Status: AC
  Filled 2024-07-05 – 2024-07-06 (×2): qty 60, 30d supply, fill #0
  Filled 2024-08-03: qty 60, 30d supply, fill #1
  Filled 2024-09-01: qty 60, 30d supply, fill #2

## 2024-07-05 MED ORDER — LOSARTAN POTASSIUM 50 MG PO TABS
50.0000 mg | ORAL_TABLET | Freq: Every day | ORAL | 2 refills | Status: DC
  Filled 2024-07-05: qty 60, 60d supply, fill #0

## 2024-07-05 NOTE — Patient Instructions (Signed)
 Continue checking your blood pressure at home, try to take readings around the same time each day in a sitting position after a few minutes of rest. If the reading is unexpectedly high, wait a few minutes and take a second reading. We are increasing your losartan  to 50 mg twice daily and continuing the chlorthalidone  12.5 mg daily. Continue to work on walking when you're able and staying hydrated!

## 2024-07-05 NOTE — Assessment & Plan Note (Signed)
 Assessment: - Blood pressure remains uncontrolled with readings ranging from 118-175 systolic at home - Blood pressure in clinic was 162/92 mmHg, diastolic pressures remain elevated as well - Taking losartan  50 mg daily and chlorthalidone  12.5 mg daily - Walks for exercise but has not been as active as she would like  Plan: - Increase losartan  to 50 mg twice daily in an effort to achieve more steady blood pressure control - Continue chlorthalidone  12.5 mg daily - Continue to check BP at home each day sitting in a relaxed position after a few minutes of rest - Check labs in 2 weeks, follow up in clinic in 1 month

## 2024-07-06 ENCOUNTER — Other Ambulatory Visit (HOSPITAL_COMMUNITY): Payer: Self-pay

## 2024-07-06 ENCOUNTER — Other Ambulatory Visit: Payer: Self-pay

## 2024-07-06 MED ORDER — GABAPENTIN 100 MG PO CAPS
200.0000 mg | ORAL_CAPSULE | Freq: Three times a day (TID) | ORAL | 0 refills | Status: AC
  Filled 2024-07-06 (×2): qty 180, 30d supply, fill #0

## 2024-07-08 ENCOUNTER — Encounter: Payer: Self-pay | Admitting: Cardiovascular Disease

## 2024-07-08 ENCOUNTER — Other Ambulatory Visit: Payer: Self-pay | Admitting: *Deleted

## 2024-07-08 ENCOUNTER — Ambulatory Visit: Attending: Cardiovascular Disease | Admitting: Cardiovascular Disease

## 2024-07-08 VITALS — BP 126/84 | HR 70 | Ht 68.0 in | Wt 207.2 lb

## 2024-07-08 DIAGNOSIS — I2510 Atherosclerotic heart disease of native coronary artery without angina pectoris: Secondary | ICD-10-CM | POA: Diagnosis not present

## 2024-07-08 DIAGNOSIS — I7121 Aneurysm of the ascending aorta, without rupture: Secondary | ICD-10-CM | POA: Diagnosis not present

## 2024-07-08 DIAGNOSIS — I10 Essential (primary) hypertension: Secondary | ICD-10-CM

## 2024-07-08 NOTE — Progress Notes (Signed)
 Chief Complaint  Patient presents with   Follow-up    HTN   History of Present Illness: 63 yo female with history of HTN, HLD, GERD, anxiety, thoracic aortic aneurysm and CAD who is here today for follow up. She has been followed in our office by Dr. Hobart and Dr. Claudene. She had a CT coronary calcium  score of 41 in February 2023. Ascending aorta 4.2 cm by non-contrast CT in 2023. Echo August 2024 with LVEF=65-70%. No valve disease. She has not tolerated statins. She stopped her Zetia  and does not wish to restart. She does not wish to consider Repatha. She was seen in our office on 05/24/24 with c/o elevated BP. She has not tolerated beta blockers, ARBs or Telmisartan  in the past. She was started on Losartan  and Chlorthalidone . She was seen in the ED 06/15/24 with vision changes and had CVA ruled out. BP was 199/111. She was seen in f/u by a neurologist and felt to have trigeminal neuralgia with facial numbness improving on Gabapentin and Robaxin . She had f/u on 07/05/24 in the PharmD clinic in our office and reported ongoing elevated BP at home. Losartan  increased to 50 mg po BID. Echo 07/05/24 with LVEF=55-60%. No valve disease. Ascending aorta 4.1 cm.   She is here today for follow up. The patient denies any chest pain, palpitations, lower extremity edema, orthopnea, PND, dizziness, near syncope or syncope. She continues to struggle with grief following the death of her husband and dog this year. She has some dyspnea when climbing stairs. He admits to decreased exercise and weight gain over the past six months during her period of mourning her losses.    Primary Care Physician: Seabron Lenis, MD   Past Medical History:  Diagnosis Date   Anxiety    Arthritis    Elevated LDL cholesterol level    Foot pain    GERD (gastroesophageal reflux disease)    Hyperlipidemia    Hypertension    Insomnia    Leukocytoclastic vasculitis (HCC)    Neck pain     Past Surgical History:  Procedure  Laterality Date   ANTERIOR CERVICAL DECOMP/DISCECTOMY FUSION N/A 07/06/2013   Procedure: ANTERIOR CERVICAL DECOMPRESSION/DISCECTOMY FUSION 2 LEVEL/HARDWARE REMOVAL;  Surgeon: Fairy Levels, MD;  Location: [facility];  Service: Neurosurgery;  Laterality: N/A;  C3-4 Anterior cervical decompression/diskectomy/fusion/removal of hardware at C6 with revision of Anterior cervical fusion at C5-6   ARTHRODESIS METATARSALPHALANGEAL JOINT (MTPJ) Left 08/02/2021   Procedure: Left hallux metatarsophalangeal joint arthrodesis;  Surgeon: Kit Rush, MD;  Location: [facility];  Service: Orthopedics;  Laterality: Left;   BREAST SURGERY     right side   CARPAL TUNNEL RELEASE     bilateral   CERVICAL SPINE SURGERY     2004   ENDOMETRIAL ABLATION     EYE SURGERY     FINGER SURGERY     left 5th finger   MOUTH SURGERY     1970's   WEIL OSTEOTOMY Left 08/02/2021   Procedure: 2-3 Weil osteotomies; lateral collateral ligament repairs;  Surgeon: Kit Rush, MD;  Location: [facility];  Service: Orthopedics;  Laterality: Left;    Current Outpatient Medications  Medication Sig Dispense Refill   ALPRAZolam  (XANAX ) 0.5 MG tablet Take 1 tablet (0.5 mg total) by mouth at bedtime as needed. 30 tablet 5   ascorbic Acid (VITAMIN C) 500 MG CPCR Take 500 mg by mouth daily. (Patient taking differently: Take 1,000 mg by mouth daily.)  aspirin 81 MG chewable tablet Chew 81 mg by mouth daily.     busPIRone  (BUSPAR ) 5 MG tablet Take 1 tablet (5 mg total) by mouth 2 (two) times daily. 60 tablet 0   chlorthalidone  (HYGROTON ) 25 MG tablet Take 0.5 tablets (12.5 mg total) by mouth daily. 45 tablet 3   Cholecalciferol (VITAMIN D3) 125 MCG (5000 UT) CAPS Take by mouth daily.     CINNAMON PO Take 1,200 mg by mouth daily.     citalopram  (CELEXA ) 20 MG tablet Take 1 tablet (20 mg total) by mouth daily. 90 tablet 1   gabapentin  (NEURONTIN ) 100 MG capsule Take 2 capsules (200 mg total) by mouth every  8 (eight) hours. 180 capsule 0   losartan  (COZAAR ) 50 MG tablet Take 1 tablet (50 mg total) by mouth in the morning and at bedtime. 60 tablet 2   methocarbamol  (ROBAXIN ) 500 MG tablet Take 1 tablet (500 mg total) by mouth every 4 (four) hours as needed. (Patient taking differently: Take 500 mg by mouth 3 (three) times daily.) 30 tablet 0   Misc Natural Products (TURMERIC CURCUMIN) CAPS Take 1 capsule by mouth daily.     pantoprazole  (PROTONIX ) 40 MG tablet Take 1 tablet (40 mg total) by mouth daily. 90 tablet 0   zolpidem  (AMBIEN  CR) 12.5 MG CR tablet Take 1 tablet (12.5 mg total) by mouth at bedtime as needed. 30 tablet 0   No current facility-administered medications for this visit.    Allergies  Allergen Reactions   Hctz [Hydrochlorothiazide]     INEFFECTIVE FOR BP   Lisinopril Other (See Comments)    REACTION: cough   Pravastatin  Sodium     Joint Pain   Pregabalin     Other reaction(s): irritable   Statins     CONSTIPATION    Telmisartan      Other reaction(s): chest tightness    Social History   Socioeconomic History   Marital status: Married    Spouse name: Not on file   Number of children: Not on file   Years of education: Not on file   Highest education level: Not on file  Occupational History   Not on file  Tobacco Use   Smoking status: Never   Smokeless tobacco: Never  Substance and Sexual Activity   Alcohol use: Yes    Comment: social   Drug use: No   Sexual activity: Not on file  Other Topics Concern   Not on file  Social History Narrative   Not on file   Social Drivers of Health   Financial Resource Strain: Not on file  Food Insecurity: Not on file  Transportation Needs: Not on file  Physical Activity: Not on file  Stress: Not on file  Social Connections: Not on file  Intimate Partner Violence: Not on file    Family History  Problem Relation Age of Onset   Hypertension Mother    Stroke Mother    Hyperthyroidism Mother    Atrial  fibrillation Mother    Diabetes Father    Hypertension Father    Heart disease Father        CABG   CAD Brother    Hypothyroidism Brother    Cancer Maternal Grandmother     Review of Systems:  As stated in the HPI and otherwise negative.   BP 126/84   Pulse 70   Ht 5' 8 (1.727 m)   Wt 207 lb 3.2 oz (94 kg)   SpO2 98%  BMI 31.50 kg/m   Physical Examination: General: Well developed, well nourished, NAD  HEENT: OP clear, mucus membranes moist  SKIN: warm, dry. No rashes. Neuro: No focal deficits  Musculoskeletal: Muscle strength 5/5 all ext  Psychiatric: Mood and affect normal  Neck: No JVD, no carotid bruits, no thyromegaly, no lymphadenopathy.  Lungs:Clear bilaterally, no wheezes, rhonci, crackles Cardiovascular: Regular rate and rhythm. No murmurs, gallops or rubs. Abdomen:Soft. Bowel sounds present. Non-tender.  Extremities: No lower extremity edema. Pulses are 2 + in the bilateral DP/PT.  EKG:  EKG is not ordered today. The ekg ordered today demonstrates   Recent Labs: No results found for requested labs within last 365 days.   Lipid Panel    Component Value Date/Time   CHOL 271 (H) 03/27/2023 0820   TRIG 86 03/27/2023 0820   HDL 67 03/27/2023 0820   CHOLHDL 4.0 03/27/2023 0820   LDLCALC 190 (H) 03/27/2023 0820    Wt Readings from Last 3 Encounters:  07/08/24 207 lb 3.2 oz (94 kg)  05/24/24 199 lb (90.3 kg)  01/29/24 196 lb 3.2 oz (89 kg)    Assessment and Plan:   1. CAD without angina: She has an abnormal coronary calcium  score in 2023. No chest pain.  She does not tolerate statins and stopped taking Zetia . LDL 190 in August 2024. She does not wish to start Repatha. She will discuss Repatha vs Nexlizet with our PharmD at her f/u visit in December 2025.   2. Thoracic aortic aneurysm: 4.2 cm by non-contrast CT in February 2023. Echo November 2025 with 4.1 cm ascending aorta. Will plan to repeat her chest CTA in one year.   3. HTN: BP is controlled.  Continue Losartan  50 mg po BID and Chlorthalidone  12.5 mg daily.   Labs/ tests ordered today include:   Orders Placed This Encounter  Procedures   Hepatic function panel   Disposition:   F/U with me in 12 months   Signed, Lonni Cash, MD, [facility] 07/08/2024 11:29 AM    [facility] [address] [place], [place], [place]  [address] Phone: [phone]; Fax: [phone]

## 2024-07-08 NOTE — Patient Instructions (Addendum)
 Medication Instructions:  Your physician recommends that you continue on your current medications as directed. Please refer to the Current Medication list given to you today.  *If you need a refill on your cardiac medications before your next appointment, please call your pharmacy*  Lab Work: Have liver function tests drawn when here for BMP If you have labs (blood work) drawn today and your tests are completely normal, you will receive your results only by: MyChart Message (if you have MyChart) OR A paper copy in the mail If you have any lab test that is abnormal or we need to change your treatment, we will call you to review the results.  Testing/Procedures: none  Follow-Up: At [facility], you and your health needs are our priority.  As part of our continuing mission to provide you with exceptional heart care, our providers are all part of one team.  This team includes your primary Cardiologist (physician) and Advanced Practice Providers or APPs (Physician Assistants and Nurse Practitioners) who all work together to provide you with the care you need, when you need it.  Your next appointment:   12 month(s)  Provider:   Lonni Cash, MD    We recommend signing up for the patient portal called MyChart.  Sign up information is provided on this After Visit Summary.  MyChart is used to connect with patients for Virtual Visits (Telemedicine).  Patients are able to view lab/test results, encounter notes, upcoming appointments, etc.  Non-urgent messages can be sent to your provider as well.   To learn more about what you can do with MyChart, go to [email or web address].   Other Instructions

## 2024-07-12 ENCOUNTER — Encounter: Payer: Self-pay | Admitting: Neurology

## 2024-07-12 ENCOUNTER — Other Ambulatory Visit (HOSPITAL_COMMUNITY): Payer: Self-pay

## 2024-07-12 ENCOUNTER — Ambulatory Visit: Admitting: Neurology

## 2024-07-12 VITALS — BP 120/78 | HR 74 | Ht 68.5 in | Wt 210.0 lb

## 2024-07-12 DIAGNOSIS — R202 Paresthesia of skin: Secondary | ICD-10-CM | POA: Insufficient documentation

## 2024-07-12 MED ORDER — SUMATRIPTAN SUCCINATE 50 MG PO TABS
50.0000 mg | ORAL_TABLET | ORAL | 6 refills | Status: AC | PRN
  Filled 2024-07-12: qty 10, 5d supply, fill #0
  Filled 2024-07-12: qty 10, 1d supply, fill #0
  Filled 2024-08-24: qty 10, 5d supply, fill #1

## 2024-07-12 NOTE — Progress Notes (Signed)
 Chief Complaint  Patient presents with   New Patient (Initial Visit)    Pt in room 14. Alone. Paper referral for trigeminal neuralgia.      ASSESSMENT AND PLAN  Charlotte [name] is a 63 y.o. female   Right facial discomfort  Essentially normal neurological examination  Normal MRI of the brain  This happened in the setting of depression anxiety, extreme stress of losing her husband in July,  May be stress related, differentiation diagnosis also include migraine variant  Suggest her taking gabapentin  up to 600 mg at bedtime, Imitrex  as needed for moderate to severe discomfort    DIAGNOSTIC DATA (LABS, IMAGING, TESTING) - I reviewed patient records, labs, notes, testing and imaging myself where available.   MEDICAL HISTORY:  Charlotte [name] is a 63 year old female, seen in request by her primary care from [facility] Dr. Seabron Lenis, for evaluation of right facial numbness, initial evaluation July 12, 2024   History is obtained from the patient and review of electronic medical records. I personally reviewed pertinent available imaging films in PACS.   PMHx of  Chronic insomnia HTN Depression, anxiety, on celexa , start Buspar  5mg  bid, Oct,  She is a retired Nutritional Therapist, went through very stressful 2025, husband was diagnosed with a rare aggressive cancer in January 2025, went through chemotherapy, eventually passed away in Mar 21, 2024 Around June 15, 2024, she woke up noticing intermittent right periorbital area numbness, no pain, symptoms gradually progressive, from intermittent to become persistent, spreading to deeper area involving the whole right face, denies right arm, leg involvement, was treated at [facility] emergency room, she brought MRI disc, MRI of the brain showed no acute abnormality, MRI of cervical spine showed multilevel degenerative changes, no significant canal or foraminal narrowing  Laboratory evaluations in October 2025, normal CMP creatinine 0.8  CBC hemoglobin 13.5, LDL 158,  She was given prescription of gabapentin  200 mg 3 times a day, Robaxin  500 mg 4 times a day, it seems to help her symptoms some, but she tends to focus on right facial discomfort, still quite bothersome for her  She complains of difficulty falling to sleep, taking Xanax , Ambien  as needed  She was seen by ophthalmologist recently, no abnormality found.  PHYSICAL EXAM:   Vitals:   07/12/24 1523  BP: 120/78  Pulse: 74  SpO2: 99%  Weight: 210 lb (95.3 kg)  Height: 5' 8.5 (1.74 m)   Body mass index is 31.47 kg/m.  PHYSICAL EXAMNIATION:  Gen: NAD, conversant, well nourised, well groomed                     Cardiovascular: Regular rate rhythm, no peripheral edema, warm, nontender. Eyes: Conjunctivae clear without exudates or hemorrhage Neck: Supple, no carotid bruits. Pulmonary: Clear to auscultation bilaterally   NEUROLOGICAL EXAM:  MENTAL STATUS: Speech/cognition: Sad looking middle-age female, awake, alert, oriented to history taking and casual conversation CRANIAL NERVES: CN II: Visual fields are full to confrontation. Pupils are round equal and briskly reactive to light. CN III, IV, VI: extraocular movement are normal. No ptosis. CN V: Facial sensation is intact to light touch CN VII: Face is symmetric with normal eye closure  CN VIII: Hearing is normal to causal conversation. CN IX, X: Phonation is normal. CN XI: Head turning and shoulder shrug are intact  MOTOR: There is no pronator drift of out-stretched arms. Muscle bulk and tone are normal. Muscle strength is normal.  REFLEXES: Reflexes are 2+ and symmetric  at the biceps, triceps, knees, and ankles. Plantar responses are flexor.  SENSORY: Intact to light touch, pinprick and vibratory sensation are intact in fingers and toes.  COORDINATION: There is no trunk or limb dysmetria noted.  GAIT/STANCE: Posture is normal. Gait is steady  REVIEW OF SYSTEMS:  Full 14 system review of  systems performed and notable only for as above All other review of systems were negative.   ALLERGIES: Allergies  Allergen Reactions   Hctz [Hydrochlorothiazide]     INEFFECTIVE FOR BP   Lisinopril Other (See Comments)    REACTION: cough   Pravastatin  Sodium     Joint Pain   Pregabalin     Other reaction(s): irritable   Statins     CONSTIPATION    Telmisartan      Other reaction(s): chest tightness    HOME MEDICATIONS: Current Outpatient Medications  Medication Sig Dispense Refill   ALPRAZolam  (XANAX ) 0.5 MG tablet Take 1 tablet (0.5 mg total) by mouth at bedtime as needed. 30 tablet 5   ascorbic Acid (VITAMIN C) 500 MG CPCR Take 500 mg by mouth daily. (Patient taking differently: Take 1,000 mg by mouth daily.)     aspirin 81 MG chewable tablet Chew 81 mg by mouth daily.     busPIRone  (BUSPAR ) 5 MG tablet Take 1 tablet (5 mg total) by mouth 2 (two) times daily. 60 tablet 0   chlorthalidone  (HYGROTON ) 25 MG tablet Take 0.5 tablets (12.5 mg total) by mouth daily. 45 tablet 3   Cholecalciferol (VITAMIN D3) 125 MCG (5000 UT) CAPS Take by mouth daily.     CINNAMON PO Take 1,200 mg by mouth daily.     citalopram  (CELEXA ) 20 MG tablet Take 1 tablet (20 mg total) by mouth daily. 90 tablet 1   gabapentin  (NEURONTIN ) 100 MG capsule Take 2 capsules (200 mg total) by mouth every 8 (eight) hours. 180 capsule 0   losartan  (COZAAR ) 50 MG tablet Take 1 tablet (50 mg total) by mouth in the morning and at bedtime. 60 tablet 2   methocarbamol  (ROBAXIN ) 500 MG tablet Take 1 tablet (500 mg total) by mouth every 4 (four) hours as needed. (Patient taking differently: Take 500 mg by mouth 3 (three) times daily.) 30 tablet 0   Misc Natural Products (TURMERIC CURCUMIN) CAPS Take 1 capsule by mouth daily.     pantoprazole  (PROTONIX ) 40 MG tablet Take 1 tablet (40 mg total) by mouth daily. 90 tablet 0   zolpidem  (AMBIEN  CR) 12.5 MG CR tablet Take 1 tablet (12.5 mg total) by mouth at bedtime as needed. 30  tablet 0   No current facility-administered medications for this visit.    PAST MEDICAL HISTORY: Past Medical History:  Diagnosis Date   Anxiety    Arthritis    Elevated LDL cholesterol level    Foot pain    GERD (gastroesophageal reflux disease)    Hyperlipidemia    Hypertension    Insomnia    Leukocytoclastic vasculitis (HCC)    Neck pain    Trigeminal neuralgia     PAST SURGICAL HISTORY: Past Surgical History:  Procedure Laterality Date   ANTERIOR CERVICAL DECOMP/DISCECTOMY FUSION N/A 07/06/2013   Procedure: ANTERIOR CERVICAL DECOMPRESSION/DISCECTOMY FUSION 2 LEVEL/HARDWARE REMOVAL;  Surgeon: Fairy Levels, MD;  Location: [facility];  Service: Neurosurgery;  Laterality: N/A;  C3-4 Anterior cervical decompression/diskectomy/fusion/removal of hardware at C6 with revision of Anterior cervical fusion at C5-6   ARTHRODESIS METATARSALPHALANGEAL JOINT (MTPJ) Left 08/02/2021   Procedure: Left hallux metatarsophalangeal  joint arthrodesis;  Surgeon: Kit Rush, MD;  Location: [facility];  Service: Orthopedics;  Laterality: Left;   BREAST SURGERY     right side   CARPAL TUNNEL RELEASE     bilateral   CERVICAL SPINE SURGERY     2004   ENDOMETRIAL ABLATION     EYE SURGERY     FINGER SURGERY     left 5th finger   MOUTH SURGERY     1970's   WEIL OSTEOTOMY Left 08/02/2021   Procedure: 2-3 Weil osteotomies; lateral collateral ligament repairs;  Surgeon: Kit Rush, MD;  Location: [facility];  Service: Orthopedics;  Laterality: Left;    FAMILY HISTORY: Family History  Problem Relation Age of Onset   Hypertension Mother    Stroke Mother    Hyperthyroidism Mother    Atrial fibrillation Mother    Diabetes Father    Hypertension Father    Heart disease Father        CABG   CAD Brother    Hypothyroidism Brother    Cancer Maternal Grandmother     SOCIAL HISTORY: Social History   Socioeconomic History   Marital status: Married    Spouse  name: Not on file   Number of children: Not on file   Years of education: Not on file   Highest education level: Not on file  Occupational History   Not on file  Tobacco Use   Smoking status: Never   Smokeless tobacco: Never  Vaping Use   Vaping status: Never Used  Substance and Sexual Activity   Alcohol use: Yes    Comment: social   Drug use: No   Sexual activity: Not on file  Other Topics Concern   Not on file  Social History Narrative   Not on file   Social Drivers of Health   Financial Resource Strain: Not on file  Food Insecurity: Not on file  Transportation Needs: Not on file  Physical Activity: Not on file  Stress: Not on file  Social Connections: Not on file  Intimate Partner Violence: Not on file      Modena Callander, M.D. Ph.D.  [facility] [address] [place], [place] [address] Ph: [phone] Fax: [phone]  CC:  Seabron Lenis, MD [address] [place],  [place] [address]  Seabron Lenis, MD

## 2024-07-12 NOTE — Patient Instructions (Signed)
 Meds ordered this encounter  Medications   SUMAtriptan  (IMITREX ) 50 MG tablet    Sig: Take 1 tablet (50 mg total) by mouth every 2 (two) hours as needed. May repeat in 2 hours if headache persists or recurs.    Dispense:  10 tablet    Refill:  6

## 2024-07-13 ENCOUNTER — Other Ambulatory Visit: Payer: Self-pay

## 2024-07-13 ENCOUNTER — Other Ambulatory Visit (HOSPITAL_COMMUNITY): Payer: Self-pay

## 2024-07-13 MED ORDER — METHOCARBAMOL 500 MG PO TABS
500.0000 mg | ORAL_TABLET | ORAL | 0 refills | Status: DC | PRN
  Filled 2024-07-13: qty 30, 5d supply, fill #0

## 2024-07-14 ENCOUNTER — Other Ambulatory Visit: Payer: Self-pay

## 2024-07-14 ENCOUNTER — Other Ambulatory Visit (HOSPITAL_COMMUNITY): Payer: Self-pay

## 2024-07-16 ENCOUNTER — Ambulatory Visit: Payer: Self-pay | Admitting: Cardiology

## 2024-07-19 ENCOUNTER — Other Ambulatory Visit (HOSPITAL_COMMUNITY): Payer: Self-pay

## 2024-07-19 MED ORDER — BUSPIRONE HCL 5 MG PO TABS
5.0000 mg | ORAL_TABLET | Freq: Two times a day (BID) | ORAL | 1 refills | Status: DC
  Filled 2024-07-19 – 2024-07-21 (×5): qty 60, 30d supply, fill #0
  Filled 2024-08-07 – 2024-08-15 (×2): qty 60, 30d supply, fill #1

## 2024-07-20 ENCOUNTER — Other Ambulatory Visit (HOSPITAL_COMMUNITY): Payer: Self-pay

## 2024-07-20 ENCOUNTER — Ambulatory Visit: Payer: Self-pay | Admitting: Cardiovascular Disease

## 2024-07-20 ENCOUNTER — Ambulatory Visit: Payer: Self-pay | Admitting: Pharmacist

## 2024-07-20 LAB — BASIC METABOLIC PANEL WITH GFR
BUN/Creatinine Ratio: 20 (ref 12–28)
BUN: 18 mg/dL (ref 8–27)
CO2: 22 mmol/L (ref 20–29)
Calcium: 10.1 mg/dL (ref 8.7–10.3)
Chloride: 97 mmol/L (ref 96–106)
Creatinine, Ser: 0.9 mg/dL (ref 0.57–1.00)
Glucose: 109 mg/dL — ABNORMAL HIGH (ref 70–99)
Potassium: 4.3 mmol/L (ref 3.5–5.2)
Sodium: 138 mmol/L (ref 134–144)
eGFR: 71 mL/min/1.73 (ref >59)

## 2024-07-20 LAB — HEPATIC FUNCTION PANEL
ALT: 39 IU/L — ABNORMAL HIGH (ref 0–32)
AST: 31 IU/L (ref 0–40)
Albumin: 4.6 g/dL (ref 3.9–4.9)
Alkaline Phosphatase: 71 IU/L (ref 49–135)
Bilirubin Total: 0.7 mg/dL (ref 0.0–1.2)
Bilirubin, Direct: 0.16 mg/dL (ref 0.00–0.40)
Total Protein: 7.5 g/dL (ref 6.0–8.5)

## 2024-07-21 ENCOUNTER — Other Ambulatory Visit (HOSPITAL_COMMUNITY): Payer: Self-pay

## 2024-07-21 ENCOUNTER — Other Ambulatory Visit: Payer: Self-pay

## 2024-07-22 ENCOUNTER — Other Ambulatory Visit (HOSPITAL_COMMUNITY): Payer: Self-pay

## 2024-07-22 ENCOUNTER — Other Ambulatory Visit: Payer: Self-pay

## 2024-07-23 ENCOUNTER — Other Ambulatory Visit (HOSPITAL_COMMUNITY): Payer: Self-pay

## 2024-07-27 ENCOUNTER — Other Ambulatory Visit (HOSPITAL_COMMUNITY): Payer: Self-pay

## 2024-07-27 ENCOUNTER — Other Ambulatory Visit: Payer: Self-pay

## 2024-07-27 MED ORDER — ESTRADIOL 0.075 MG/24HR TD PTTW
1.0000 | MEDICATED_PATCH | TRANSDERMAL | 3 refills | Status: AC
  Filled 2024-07-27: qty 24, 84d supply, fill #0

## 2024-08-02 ENCOUNTER — Ambulatory Visit: Attending: Cardiology | Admitting: Pharmacist

## 2024-08-02 ENCOUNTER — Other Ambulatory Visit (HOSPITAL_COMMUNITY): Payer: Self-pay

## 2024-08-02 ENCOUNTER — Other Ambulatory Visit: Payer: Self-pay

## 2024-08-02 VITALS — BP 150/80 | HR 82

## 2024-08-02 DIAGNOSIS — I10 Essential (primary) hypertension: Secondary | ICD-10-CM

## 2024-08-02 MED ORDER — CHLORTHALIDONE 25 MG PO TABS
25.0000 mg | ORAL_TABLET | Freq: Every day | ORAL | 3 refills | Status: DC
  Filled 2024-08-02 (×2): qty 90, 90d supply, fill #0

## 2024-08-02 NOTE — Patient Instructions (Addendum)
 Please continue taking losartan  50mg  twice a day Please increase chlorthalidone  to 25mg  daily  Please get BMP (labs) in 1-2 weeks  Continue to monitor blood pressure at home, decrease sodium intake and increase exercise.  Your blood pressure goal is < 130/59mmHg    Important lifestyle changes to control high blood pressure  Intervention  Effect on the BP   Weight loss Weight loss is one of the most effective lifestyle changes for controlling blood pressure. If you're overweight or obese, losing even a small amount of weight can help reduce blood pressure.    Blood pressure can decrease by 1 millimeter of mercury (mmHg) with each kilogram (about 2.2 pounds) of weight lost.   Exercise regularly As a general goal, aim for 30 minutes of moderate physical activity every day.    Regular physical activity can lower blood pressure by 5 - 8 mmHg.   Eat a healthy diet Eat a diet rich in whole grains, fruits, vegetables, lean meat, and low-fat dairy products. Limit processed foods, saturated fat, and sweets.    A heart-healthy diet can lower high blood pressure by 10 mmHg.   Reduce salt (sodium) in your diet Aim for 000mg  of sodium each day. Avoid deli meats, canned food, and frozen microwave meals which are high in sodium.     Limiting sodium can reduce blood pressure by 5 mmHg.   Limit alcohol One drink equals 12 ounces of beer, 5 ounces of wine, or 1.5 ounces of 80-proof liquor.    Limiting alcohol to < 1 drink a day for women or < 2 drinks a day for men can help lower blood pressure by about 4 mmHg.   To check your pressure at home you will need to:   Sit up in a chair, with feet flat on the floor and back supported. Do not cross your ankles or legs. Rest your left arm so that the cuff is about heart level. If the cuff goes on your upper arm, then just relax your arm on the table, arm of the chair, or your lap. If you have a wrist cuff, hold your wrist against your chest at  heart level. Place the cuff snugly around your arm, about 1 inch above the crease of your elbow. The cords should be inside the groove of your elbow.  Sit quietly, with the cuff in place, for about 5 minutes. Then press the power button to start a reading. Do not talk or move while the reading is taking place.  Record your readings on a sheet of paper. Although most cuffs have a memory, it is often easier to see a pattern developing when the numbers are all in front of you.  You can repeat the reading after 1-3 minutes if it is recommended.   Make sure your bladder is empty and you have not had caffeine or tobacco within the last 30 minutes   Always bring your blood pressure log with you to your appointments. If you have not brought your monitor in to be double checked for accuracy, please bring it to your next appointment.   You can find a list of validated (accurate) blood pressure cuffs at: validatebp.org

## 2024-08-02 NOTE — Assessment & Plan Note (Signed)
 Assessment: Blood pressure elevated in clinic today, probably due to pain and poor sleep Home blood pressures average a little above goal Some exercise, but room for increase Fair amount of sodium in diet Dealing with stress   Plan: Increase chlorthalidone  to 25mg  daily BMP in 1-2 weeks F/u in clinic in 6 weeks Decrease sodium intake and increase exercise

## 2024-08-02 NOTE — Progress Notes (Signed)
 Patient ID: Charlotte [name]                 DOB: [birth year]                      MRN: [id number]      HPI: Charlotte [name] is a 63 y.o. female referred by Dr. Michele to HTN clinic. PMH is significant for HTN, HLD, GERD, anxiety, thoracic aortic aneurysm, CAD, mild coronary calcification.   Saw Dr. Michele in clinic on 05/24/2024 for elevated blood pressure. At this visit she explained that she was under a lot of stress due to the death of her husband and dog. Endorses elevated blood pressure along with pulsation in her right eye. BP at this visit was 162/100 mmHg. Restarted on losartan  and chlorthalidone  at this appointment. Seen in ER on 10/28 for vision changes, stroke ruled out, BP charted at 199/111 mmHg. Labs on 06/17/2024 were within normal limits.    Patient seen in PharmD clinic 07/05/24. Losartan  was increased to 50mg  BID. She was seen by Dr. Verlin 07/08/24. BP was improved at 126/84. Follow up BMP was WNL.   Patient presents today for follow up. She states her average BP at home for Dec is 133/85. She does admit to eating out fairly often. She also eats foods high in sodium including fast food, mexican and asian. Limits her caffeine. Has been walking 1-2 times per week for about each. Did not sleep much last night. Has headache due to neck pain. Denies any dizziness.   133/85 average Dec  127/84, 139/85, 129/81, 121/79   Walking 1-2 times per week for 45 min each time  Current HTN meds: losartan  50 mg twice a day, chlorthalidone  12.5 mg daily Previously tried: telmisartan  (chest pain), metoprolol  (changed affect), lisinopril (cough), hydrochlorothiazide (ineffective), amlodipine  (felt terrible) BP goal: < 130/80 mmHg  Family History:  Family History  Problem Relation Age of Onset   Hypertension Mother    Stroke Mother    Hyperthyroidism Mother    Atrial fibrillation Mother    Diabetes Father    Hypertension Father    Heart disease Father        CABG   CAD Brother     Hypothyroidism Brother    Cancer Maternal Grandmother     Social History:  Social Drivers of Health   Tobacco Use: Low Risk (07/12/2024)   Patient History    Smoking Tobacco Use: Never    Smokeless Tobacco Use: Never    Passive Exposure: Not on file  Financial Resource Strain: Not on file  Food Insecurity: Not on file  Transportation Needs: Not on file  Physical Activity: Not on file  Stress: Not on file  Social Connections: Not on file  Depression (EYV7-0): Not on file  Alcohol Screen: Not on file  Housing: Not on file  Utilities: Not on file  Health Literacy: Not on file     Diet: 1-2 cups of coffee per day, rare soda 50/50 home cooked vs take out Zyaxby, chick fil A, fajitas, stir fry    Exercise: walking around one to three times per week   Wt Readings from Last 3 Encounters:  07/12/24 210 lb (95.3 kg)  07/08/24 207 lb 3.2 oz (94 kg)  05/24/24 199 lb (90.3 kg)   BP Readings from Last 3 Encounters:  08/02/24 (!) 150/80  07/12/24 120/78  07/08/24 126/84   Pulse Readings from Last 3 Encounters:  08/02/24 82  07/12/24  74  07/08/24 70    Renal function: CrCl cannot be calculated (Unknown ideal weight.).  Past Medical History:  Diagnosis Date   Anxiety    Arthritis    Elevated LDL cholesterol level    Foot pain    GERD (gastroesophageal reflux disease)    Hyperlipidemia    Hypertension    Insomnia    Leukocytoclastic vasculitis (HCC)    Neck pain    Trigeminal neuralgia     Current Outpatient Medications on File Prior to Visit  Medication Sig Dispense Refill   progesterone  (PROMETRIUM ) 200 MG capsule Take 200 mg by mouth daily.     ALPRAZolam  (XANAX ) 0.5 MG tablet Take 1 tablet (0.5 mg total) by mouth at bedtime as needed. 30 tablet 5   ascorbic Acid (VITAMIN C) 500 MG CPCR Take 500 mg by mouth daily. (Patient taking differently: Take 1,000 mg by mouth daily.)     aspirin 81 MG chewable tablet Chew 81 mg by mouth daily.     busPIRone  (BUSPAR )  5 MG tablet Take 1 tablet (5 mg total) by mouth 2 (two) times daily. 60 tablet 1   Cholecalciferol (VITAMIN D3) 125 MCG (5000 UT) CAPS Take by mouth daily.     CINNAMON PO Take 1,200 mg by mouth daily.     citalopram  (CELEXA ) 20 MG tablet Take 1 tablet (20 mg total) by mouth daily. 90 tablet 1   estradiol  (DOTTI ) 0.075 MG/24HR Place 1 patch onto the skin 2 (two) times a week. 24 patch 3   gabapentin  (NEURONTIN ) 100 MG capsule Take 2 capsules (200 mg total) by mouth every 8 (eight) hours. 180 capsule 0   losartan  (COZAAR ) 50 MG tablet Take 1 tablet (50 mg total) by mouth in the morning and at bedtime. 60 tablet 2   methocarbamol  (ROBAXIN ) 500 MG tablet Take 1 tablet (500 mg total) by mouth every 4 (four) hours as needed. 30 tablet 0   Misc Natural Products (TURMERIC CURCUMIN) CAPS Take 1 capsule by mouth daily.     pantoprazole  (PROTONIX ) 40 MG tablet Take 1 tablet (40 mg total) by mouth daily. 90 tablet 0   SUMAtriptan  (IMITREX ) 50 MG tablet Take 1 tablet (50 mg total) by mouth every 2 (two) hours as needed. May repeat in 2 hours if headache persists or recurs. 10 tablet 6   zolpidem  (AMBIEN  CR) 12.5 MG CR tablet Take 1 tablet (12.5 mg total) by mouth at bedtime as needed. 30 tablet 0   No current facility-administered medications on file prior to visit.    Allergies  Allergen Reactions   Hctz [Hydrochlorothiazide]     INEFFECTIVE FOR BP   Lisinopril Other (See Comments)    REACTION: cough   Pravastatin  Sodium     Joint Pain   Pregabalin     Other reaction(s): irritable   Statins     CONSTIPATION    Telmisartan      Other reaction(s): chest tightness    Blood pressure (!) 150/80, pulse 82.   Assessment/Plan: HYPERTENSION CONTROL Vitals:   08/02/24 1258 08/02/24 1259  BP: (!) 145/80 (!) 150/80    The patient's blood pressure is elevated above target today.  In order to address the patient's elevated BP:       1. Hypertension -  Essential hypertension Assessment: Blood  pressure elevated in clinic today, probably due to pain and poor sleep Home blood pressures average a little above goal Some exercise, but room for increase Fair amount of sodium in diet  Dealing with stress   Plan: Increase chlorthalidone  to 25mg  daily BMP in 1-2 weeks F/u in clinic in 6 weeks Decrease sodium intake and increase exercise    Thank you  Nidia Schaffer, PharmD PGY2 Cardiology Pharmacy Resident

## 2024-08-03 ENCOUNTER — Other Ambulatory Visit (HOSPITAL_COMMUNITY): Payer: Self-pay

## 2024-08-05 ENCOUNTER — Other Ambulatory Visit (HOSPITAL_COMMUNITY): Payer: Self-pay

## 2024-08-05 MED ORDER — PANTOPRAZOLE SODIUM 40 MG PO TBEC
40.0000 mg | DELAYED_RELEASE_TABLET | Freq: Every day | ORAL | 0 refills | Status: AC
  Filled 2024-08-05: qty 90, 90d supply, fill #0

## 2024-08-07 ENCOUNTER — Other Ambulatory Visit (HOSPITAL_COMMUNITY): Payer: Self-pay

## 2024-08-13 ENCOUNTER — Other Ambulatory Visit (HOSPITAL_COMMUNITY): Payer: Self-pay

## 2024-08-15 ENCOUNTER — Other Ambulatory Visit (HOSPITAL_COMMUNITY): Payer: Self-pay

## 2024-08-24 ENCOUNTER — Other Ambulatory Visit (HOSPITAL_COMMUNITY): Payer: Self-pay

## 2024-08-25 ENCOUNTER — Other Ambulatory Visit (HOSPITAL_COMMUNITY): Payer: Self-pay

## 2024-08-25 ENCOUNTER — Other Ambulatory Visit: Payer: Self-pay

## 2024-08-25 MED ORDER — METHOCARBAMOL 500 MG PO TABS
500.0000 mg | ORAL_TABLET | ORAL | 0 refills | Status: AC | PRN
  Filled 2024-08-25: qty 30, 5d supply, fill #0

## 2024-08-26 ENCOUNTER — Other Ambulatory Visit (HOSPITAL_COMMUNITY): Payer: Self-pay

## 2024-08-26 MED ORDER — ALPRAZOLAM 0.5 MG PO TABS
0.5000 mg | ORAL_TABLET | Freq: Every evening | ORAL | 1 refills | Status: AC | PRN
  Filled 2024-08-26: qty 30, 30d supply, fill #0

## 2024-08-31 ENCOUNTER — Telehealth: Payer: Self-pay | Admitting: Pharmacist

## 2024-08-31 LAB — BASIC METABOLIC PANEL WITH GFR
BUN/Creatinine Ratio: 23 (ref 12–28)
BUN: 18 mg/dL (ref 8–27)
CO2: 24 mmol/L (ref 20–29)
Calcium: 9.9 mg/dL (ref 8.7–10.3)
Chloride: 97 mmol/L (ref 96–106)
Creatinine, Ser: 0.79 mg/dL (ref 0.57–1.00)
Glucose: 108 mg/dL — ABNORMAL HIGH (ref 70–99)
Potassium: 4 mmol/L (ref 3.5–5.2)
Sodium: 139 mmol/L (ref 134–144)
eGFR: 83 mL/min/1.73

## 2024-08-31 NOTE — Telephone Encounter (Signed)
 Called patient to move her appointment time due to interview conflict.  Patient was getting her labs drawn at that time.  She reported feeling more crampy and stiff since increasing the chlorthalidone .  Has not really noticed much difference in her blood pressure.  Advised that she can decrease back to 12.5 mg of chlorthalidone  to see if she notices any difference.  She was willing to move her appointment to 2:30 on the 30th.

## 2024-09-01 ENCOUNTER — Other Ambulatory Visit (HOSPITAL_COMMUNITY): Payer: Self-pay

## 2024-09-01 ENCOUNTER — Other Ambulatory Visit: Payer: Self-pay

## 2024-09-02 ENCOUNTER — Ambulatory Visit: Payer: Self-pay | Admitting: Pharmacist

## 2024-09-15 ENCOUNTER — Other Ambulatory Visit (HOSPITAL_COMMUNITY): Payer: Self-pay

## 2024-09-15 ENCOUNTER — Other Ambulatory Visit: Payer: Self-pay

## 2024-09-15 MED ORDER — BUSPIRONE HCL 5 MG PO TABS
5.0000 mg | ORAL_TABLET | Freq: Two times a day (BID) | ORAL | 0 refills | Status: AC
  Filled 2024-09-15: qty 60, 30d supply, fill #0

## 2024-09-15 NOTE — Progress Notes (Signed)
 Patient ID: Charlotte [name]                 DOB: 28-Nov-1960                      MRN: [id number]      HPI: Charlotte [name] is a 64 y.o. female referred by Dr. Michele to HTN clinic. PMH is significant for HTN, HLD, GERD, anxiety, thoracic aortic aneurysm, CAD, mild coronary calcification.   Saw Dr. Michele in clinic on 05/24/2024 for elevated blood pressure. At this visit she explained that she was under a lot of stress due to the death of her husband and dog. Endorses elevated blood pressure along with pulsation in her right eye. BP at this visit was 162/100 mmHg. Restarted on losartan  and chlorthalidone  at this appointment. Seen in ER on 10/28 for vision changes, stroke ruled out, BP charted at 199/111 mmHg. Labs on 06/17/2024 were within normal limits.    Patient seen in PharmD clinic on 07/05/24. Losartan  was increased to 50 mg BID. She was seen by Dr. Verlin on 07/08/24. BP was improved at 126/84. Follow-up BMP was WNL. Last seen by PharmD on 08/02/24. Chlorthalidone  was increased to 25 mg daily. BP in the clinic was 145/80; the elevation was attributed to pain and poor sleep. Home readings ranged from 121-139/79-85 mmHg. The follow-up BMP was normal.  At today's visit, the patient brings in her blood pressure logs, which averaged 132/86 mmHg for January with a range from 140-119/81-78 mmHg. She has cut back her chlorthalidone  after having cramping and stiffness in her legs but has been doing well on the 12.5 mg dose. She reports actively working on dietary and exercise habits, she has cut back on take out and has started to eat more lean meats and vegetables.   Current HTN meds: losartan  50 mg twice a day, chlorthalidone  12.5 mg daily Previously tried: telmisartan  (chest pain), metoprolol  (changed affect), lisinopril (cough), hydrochlorothiazide (ineffective), amlodipine  (felt terrible) BP goal: < 130/80 mmHg  Family History:  Family History  Problem Relation Age of Onset   Hypertension  Charlotte [name]    Stroke Charlotte [name]    Hyperthyroidism Charlotte [name]    Atrial fibrillation Charlotte [name]    Diabetes Charlotte [name]    Hypertension Charlotte [name]    Heart disease Charlotte [name]        CABG   CAD Charlotte [name]    Hypothyroidism Charlotte [name]    Cancer Charlotte [name]    Social History:  Social Drivers of Health   Tobacco Use: Low Risk (07/12/2024)   Patient History    Smoking Tobacco Use: Never    Smokeless Tobacco Use: Never    Passive Exposure: Not on file  Financial Resource Strain: Not on file  Food Insecurity: Not on file  Transportation Needs: Not on file  Physical Activity: Not on file  Stress: Not on file  Social Connections: Not on file  Depression (EYV7-0): Not on file  Alcohol Screen: Not on file  Housing: Not on file  Utilities: Not on file  Health Literacy: Not on file    Diet: 1-2 cups of coffee per day, rare soda 50/50 home cooked vs take out [place], chick fil A, fajitas, stir fry   Exercise: walking around one to three times per week  Wt Readings from Last 3 Encounters:  07/12/24 210 lb (95.3 kg)  07/08/24 207 lb 3.2 oz (94 kg)  05/24/24 199 lb (90.3 kg)   BP Readings from Last 3 Encounters:  09/17/24  130/78  08/02/24 (!) 150/80  07/12/24 120/78   Pulse Readings from Last 3 Encounters:  09/17/24 68  08/02/24 82  07/12/24 74   Renal function: CrCl cannot be calculated (Unknown ideal weight.).  Past Medical History:  Diagnosis Date   Anxiety    Arthritis    Elevated LDL cholesterol level    Foot pain    GERD (gastroesophageal reflux disease)    Hyperlipidemia    Hypertension    Insomnia    Leukocytoclastic vasculitis (HCC)    Neck pain    Trigeminal neuralgia    Current Outpatient Medications on File Prior to Visit  Medication Sig Dispense Refill   ALPRAZolam  (XANAX ) 0.5 MG tablet Take 1 tablet (0.5 mg total) by mouth at bedtime as needed. 30 tablet 1   aspirin 81 MG chewable tablet Chew 81 mg by mouth daily.     Cholecalciferol (VITAMIN D3) 125 MCG (5000 UT) CAPS  Take by mouth daily.     losartan  (COZAAR ) 50 MG tablet Take 1 tablet (50 mg total) by mouth in the morning and at bedtime. 60 tablet 2   ascorbic Acid (VITAMIN C) 500 MG CPCR Take 500 mg by mouth daily. (Patient taking differently: Take 1,000 mg by mouth daily.)     busPIRone  (BUSPAR ) 5 MG tablet Take 1 tablet (5 mg total) by mouth 2 (two) times daily. 60 tablet 0   CINNAMON PO Take 1,200 mg by mouth daily.     citalopram  (CELEXA ) 20 MG tablet Take 1 tablet (20 mg total) by mouth daily. 90 tablet 1   estradiol  (DOTTI ) 0.075 MG/24HR Place 1 patch onto the skin 2 (two) times a week. 24 patch 3   gabapentin  (NEURONTIN ) 100 MG capsule Take 2 capsules (200 mg total) by mouth every 8 (eight) hours. 180 capsule 0   methocarbamol  (ROBAXIN ) 500 MG tablet Take 1 tablet (500 mg total) by mouth every 4 (four) hours as needed. 30 tablet 0   Misc Natural Products (TURMERIC CURCUMIN) CAPS Take 1 capsule by mouth daily.     pantoprazole  (PROTONIX ) 40 MG tablet Take 1 tablet (40 mg total) by mouth daily 30 minutes to 1 hour before morning meal. 90 tablet 0   progesterone  (PROMETRIUM ) 200 MG capsule Take 200 mg by mouth daily.     SUMAtriptan  (IMITREX ) 50 MG tablet Take 1 tablet (50 mg total) by mouth every 2 (two) hours as needed. May repeat in 2 hours if headache persists or recurs. 10 tablet 6   zolpidem  (AMBIEN  CR) 12.5 MG CR tablet Take 1 tablet (12.5 mg total) by mouth at bedtime as needed. 30 tablet 0   No current facility-administered medications on file prior to visit.   Allergies  Allergen Reactions   Hctz [Hydrochlorothiazide]     INEFFECTIVE FOR BP   Lisinopril Other (See Comments)    REACTION: cough   Pravastatin  Sodium     Joint Pain   Pregabalin     Other reaction(s): irritable   Statins     CONSTIPATION    Telmisartan      Other reaction(s): chest tightness   Blood pressure 130/78, pulse 68.  Assessment/Plan:     1. Hypertension -  Essential hypertension Assessment:  Patient is  close to BP goal of <130/80 mmHg. Considering her medication sensitivities, will continue therapy as prescribed and continue monitoring.  Discussed lifestyle changes, including reduced sodium intake through take out foods and to prioritize more home cooked, healthier alternatives.   Plan:  Continue chlorthalidone   12.5 mg daily Continue losartan  50 mg twice daily Encouraged continued lifestyle changes with reduced sodium and increased exercise  Encouraged her to contact the clinic if she notices any increases in her blood pressure. Provided her a direct call back number.   Thank you Woodie Jock, PharmD PGY1 Pharmacy Resident  09/17/2024  Eleanor JONETTA Crews, Pharm.JONETTA SARAN, CPP [facility] [address], [place], [place] [address]  Phone: [phone]; Fax: [phone]

## 2024-09-17 ENCOUNTER — Ambulatory Visit: Attending: Internal Medicine | Admitting: Pharmacist

## 2024-09-17 VITALS — BP 130/78 | HR 68

## 2024-09-17 DIAGNOSIS — I10 Essential (primary) hypertension: Secondary | ICD-10-CM | POA: Diagnosis not present

## 2024-09-17 MED ORDER — CHLORTHALIDONE 12.5 MG PO TABS: 25.0000 mg | ORAL_TABLET | Freq: Every day | ORAL | 3 refills | Status: AC

## 2024-09-17 NOTE — Patient Instructions (Addendum)
 Continue losartan  50 mg twice a day Continue chlorthalidone  12.5 mg daily  Please continue checking your blood pressure at home. Call me at [phone] with any issues.

## 2024-09-17 NOTE — Assessment & Plan Note (Addendum)
 Assessment:  Patient is close to BP goal of <130/80 mmHg. Considering her medication sensitivities, will continue therapy as prescribed and continue monitoring.  Discussed lifestyle changes, including reduced sodium intake through take out foods and to prioritize more home cooked, healthier alternatives.   Plan:  Continue chlorthalidone  12.5 mg daily Continue losartan  50 mg twice daily Encouraged continued lifestyle changes with reduced sodium and increased exercise  Encouraged her to contact the clinic if she notices any increases in her blood pressure. Provided her a direct call back number.
